# Patient Record
Sex: Male | Born: 1975 | Race: White | Hispanic: No | Marital: Single
Health system: Midwestern US, Community
[De-identification: ages and names within clinical notes are randomized; demographics above are authoritative.]

## PROBLEM LIST (undated history)

## (undated) DIAGNOSIS — F419 Anxiety disorder, unspecified: Secondary | ICD-10-CM

## (undated) DIAGNOSIS — I2699 Other pulmonary embolism without acute cor pulmonale: Secondary | ICD-10-CM

## (undated) DIAGNOSIS — F4310 Post-traumatic stress disorder, unspecified: Secondary | ICD-10-CM

## (undated) HISTORY — DX: Anxiety disorder, unspecified: F41.9

## (undated) HISTORY — DX: Post-traumatic stress disorder, unspecified: F43.10

## (undated) HISTORY — DX: Other pulmonary embolism without acute cor pulmonale: I26.99

---

## 2017-07-01 ENCOUNTER — Emergency Department: Admit: 2017-07-01 | Payer: Medicaid (Managed Care)

## 2017-07-01 ENCOUNTER — Emergency Department: Payer: Medicaid (Managed Care)

## 2017-07-01 ENCOUNTER — Inpatient Hospital Stay
Admit: 2017-07-01 | Discharge: 2017-07-03 | Disposition: A | Discharge status: Home / Self Care | Payer: Medicaid (Managed Care) | Source: Ambulatory Visit | Admitting: Internal Medicine

## 2017-07-01 DIAGNOSIS — K5732 Diverticulitis of large intestine without perforation or abscess without bleeding: Secondary | ICD-10-CM

## 2017-07-01 LAB — CBC WITH AUTO DIFFERENTIAL
Basophils %: 0.2 % (ref 0.0–1.0)
Basophils Absolute: 0 10*3/uL (ref 0.00–0.20)
Eosinophils %: 0.6 % (ref 0.0–5.0)
Eosinophils Absolute: 0.1 10*3/uL (ref 0.00–0.60)
Hematocrit: 48.6 % (ref 42.0–52.0)
Hemoglobin: 17.4 g/dL (ref 14.0–18.0)
Lymphocytes %: 3.8 % — ABNORMAL LOW (ref 20.0–40.0)
Lymphocytes Absolute: 0.4 10*3/uL — ABNORMAL LOW (ref 1.1–4.5)
MCH: 33.1 pg — ABNORMAL HIGH (ref 27.0–31.0)
MCHC: 35.8 g/dL (ref 33.0–37.0)
MCV: 92.4 fL (ref 80.0–94.0)
MPV: 9.9 fL (ref 9.4–12.4)
Monocytes %: 4.8 % (ref 0.0–10.0)
Monocytes Absolute: 0.5 10*3/uL (ref 0.00–0.90)
Neutrophils %: 90.2 % — ABNORMAL HIGH (ref 50.0–65.0)
Neutrophils Absolute: 9.4 10*3/uL — ABNORMAL HIGH (ref 1.5–7.5)
Platelets: 186 10*3/uL (ref 130–400)
RBC: 5.26 M/uL (ref 4.70–6.10)
RDW: 12.3 % (ref 11.5–14.5)
WBC: 10.4 10*3/uL (ref 4.8–10.8)

## 2017-07-01 LAB — COMPREHENSIVE METABOLIC PANEL
ALT: 30 U/L (ref 5–41)
AST: 24 U/L (ref 5–40)
Albumin: 4.5 g/dL (ref 3.5–5.2)
Alkaline Phosphatase: 62 U/L (ref 40–130)
Anion Gap: 14 mmol/L (ref 7–19)
BUN: 17 mg/dL (ref 6–20)
CO2: 22 mmol/L (ref 22–29)
Calcium: 9.5 mg/dL (ref 8.6–10.0)
Chloride: 100 mmol/L (ref 98–111)
Creatinine: 1.1 mg/dL (ref 0.5–1.2)
GFR Non-African American: >60 (ref >60)
Glucose: 114 mg/dL — ABNORMAL HIGH (ref 74–109)
Potassium: 4.4 mmol/L (ref 3.5–5.0)
Sodium: 136 mmol/L (ref 136–145)
Total Bilirubin: 0.9 mg/dL (ref 0.2–1.2)
Total Protein: 7.4 g/dL (ref 6.6–8.7)

## 2017-07-01 LAB — LIPASE: Lipase: 46 U/L (ref 13–60)

## 2017-07-01 LAB — URINALYSIS WITH REFLEX TO CULTURE
Bilirubin Urine: NEGATIVE
Blood, Urine: NEGATIVE
Glucose, Ur: NEGATIVE mg/dL
Leukocyte Esterase, Urine: NEGATIVE
Nitrite, Urine: NEGATIVE
Protein, UA: NEGATIVE mg/dL
Specific Gravity, UA: 1.016 (ref 1.005–1.030)
Urobilinogen, Urine: 0.2 E.U./dL (ref <2.0)
pH, UA: 6.5 (ref 5.0–8.0)

## 2017-07-01 LAB — ETHANOL: Ethanol Lvl: <10 mg/dL

## 2017-07-01 LAB — URINE DRUG SCREEN
Amphetamine Screen, Urine: NEGATIVE (ref <1000)
Barbiturate Screen, Ur: NEGATIVE (ref <200)
Benzodiazepine Screen, Urine: POSITIVE — AB (ref <100)
Cannabinoid Scrn, Ur: POSITIVE — AB (ref <50)
Cocaine Metabolite Screen, Urine: NEGATIVE (ref <300)
Opiate Scrn, Ur: NEGATIVE (ref <300)

## 2017-07-01 LAB — TYPE AND SCREEN
ABO/Rh: O NEG
Antibody Screen: NEGATIVE

## 2017-07-01 LAB — PROTIME-INR
INR: 1.05 (ref 0.88–1.18)
Protime: 13.1 s (ref 12.0–14.6)

## 2017-07-01 LAB — LACTIC ACID: Lactic Acid: 1.1 mmol/L (ref 0.5–1.9)

## 2017-07-01 LAB — TROPONIN: Troponin: <0.01 ng/mL (ref 0.00–0.03)

## 2017-07-01 MED ORDER — METRONIDAZOLE IN NACL 5-0.79 MG/ML-% IV SOLN
5 mg/mL | Freq: Once | INTRAVENOUS | Status: AC
Start: 2017-07-01 — End: 2017-07-01
  Administered 2017-07-01: 18:00:00 500 mg via INTRAVENOUS

## 2017-07-01 MED ORDER — METOCLOPRAMIDE HCL 5 MG/ML IJ SOLN
5 MG/ML | Freq: Once | INTRAMUSCULAR | Status: DC
Start: 2017-07-01 — End: 2017-07-01

## 2017-07-01 MED ORDER — SODIUM CHLORIDE 0.9 % IV SOLN
0.9 % | INTRAVENOUS | Status: DC
Start: 2017-07-01 — End: 2017-07-03
  Administered 2017-07-02 – 2017-07-03 (×2): via INTRAVENOUS

## 2017-07-01 MED ORDER — INFUVITE ADULT IV INJ
0.9 | Freq: Every day | INTRAVENOUS | Status: DC
Start: 2017-07-01 — End: 2017-07-03
  Administered 2017-07-01 – 2017-07-02 (×2): via INTRAVENOUS

## 2017-07-01 MED ORDER — ENOXAPARIN SODIUM 40 MG/0.4ML SC SOLN
40 MG/0.4ML | Freq: Every day | SUBCUTANEOUS | Status: DC
Start: 2017-07-01 — End: 2017-07-03
  Administered 2017-07-01 – 2017-07-02 (×2): 40 mg via SUBCUTANEOUS

## 2017-07-01 MED ORDER — ALUM & MAG HYDROXIDE-SIMETH 200-200-20 MG/5ML PO SUSP
200-200-205 MG/5ML | Freq: Once | ORAL | Status: AC
Start: 2017-07-01 — End: 2017-07-01
  Administered 2017-07-01: 17:00:00 via ORAL

## 2017-07-01 MED ORDER — LORAZEPAM 2 MG/ML IJ SOLN
2 MG/ML | Freq: Once | INTRAMUSCULAR | Status: AC
Start: 2017-07-01 — End: 2017-07-01
  Administered 2017-07-01: 17:00:00 1 mg via INTRAVENOUS

## 2017-07-01 MED ORDER — SODIUM CHLORIDE 0.9 % IV BOLUS
0.9 % | Freq: Once | INTRAVENOUS | Status: AC
Start: 2017-07-01 — End: 2017-07-01
  Administered 2017-07-01: 22:00:00 1000 mL via INTRAVENOUS

## 2017-07-01 MED ORDER — LORAZEPAM 2 MG/ML IJ SOLN
2 MG/ML | Freq: Once | INTRAMUSCULAR | Status: AC
Start: 2017-07-01 — End: 2017-07-01
  Administered 2017-07-01: 18:00:00 2 mg via INTRAVENOUS

## 2017-07-01 MED ORDER — SODIUM CHLORIDE 0.9 % IV BOLUS
0.9 % | Freq: Once | INTRAVENOUS | Status: AC
Start: 2017-07-01 — End: 2017-07-01
  Administered 2017-07-01: 17:00:00 1000 mL via INTRAVENOUS

## 2017-07-01 MED ORDER — METOPROLOL TARTRATE 5 MG/5ML IV SOLN
5 MG/ML | Freq: Once | INTRAVENOUS | Status: AC
Start: 2017-07-01 — End: 2017-07-01
  Administered 2017-07-01: 17:00:00 5 mg via INTRAVENOUS

## 2017-07-01 MED ORDER — NORMAL SALINE FLUSH 0.9 % IV SOLN
0.9 % | Freq: Two times a day (BID) | INTRAVENOUS | Status: DC
Start: 2017-07-01 — End: 2017-07-03
  Administered 2017-07-02: 01:00:00 10 mL via INTRAVENOUS

## 2017-07-01 MED ORDER — DIAZEPAM 5 MG PO TABS
5 MG | Freq: Two times a day (BID) | ORAL | Status: DC | PRN
Start: 2017-07-01 — End: 2017-07-03
  Administered 2017-07-01 – 2017-07-03 (×3): 5 mg via ORAL

## 2017-07-01 MED ORDER — CIPROFLOXACIN HCL 500 MG PO TABS
500 MG | Freq: Once | ORAL | Status: AC
Start: 2017-07-01 — End: 2017-07-01
  Administered 2017-07-01: 18:00:00 500 mg via ORAL

## 2017-07-01 MED ORDER — ATORVASTATIN CALCIUM 20 MG PO TABS
20 MG | Freq: Every day | ORAL | Status: DC
Start: 2017-07-01 — End: 2017-07-03
  Administered 2017-07-02 – 2017-07-03 (×2): 20 mg via ORAL

## 2017-07-01 MED ORDER — NORMAL SALINE FLUSH 0.9 % IV SOLN
0.9 | INTRAVENOUS | Status: DC | PRN
Start: 2017-07-01 — End: 2017-07-03

## 2017-07-01 MED ORDER — ACETAMINOPHEN 325 MG PO TABS
325 | Freq: Four times a day (QID) | ORAL | Status: DC | PRN
Start: 2017-07-01 — End: 2017-07-03

## 2017-07-01 MED ORDER — METRONIDAZOLE IN NACL 5-0.79 MG/ML-% IV SOLN
5 mg/mL | Freq: Three times a day (TID) | INTRAVENOUS | Status: DC
Start: 2017-07-01 — End: 2017-07-03
  Administered 2017-07-02 – 2017-07-03 (×5): 500 mg via INTRAVENOUS

## 2017-07-01 MED ORDER — IOPAMIDOL 76 % IV SOLN
76 % | Freq: Once | INTRAVENOUS | Status: AC | PRN
Start: 2017-07-01 — End: 2017-07-01
  Administered 2017-07-01: 16:00:00 90 mL via INTRAVENOUS

## 2017-07-01 MED ORDER — ASPIRIN 81 MG PO CHEW
81 MG | Freq: Every day | ORAL | Status: DC
Start: 2017-07-01 — End: 2017-07-03
  Administered 2017-07-01 – 2017-07-03 (×3): 81 mg via ORAL

## 2017-07-01 MED ORDER — CIPROFLOXACIN HCL 500 MG PO TABS
500 MG | Freq: Two times a day (BID) | ORAL | Status: DC
Start: 2017-07-01 — End: 2017-07-03
  Administered 2017-07-02 – 2017-07-03 (×4): 500 mg via ORAL

## 2017-07-01 MED FILL — LORAZEPAM 2 MG/ML IJ SOLN: 2 mg/mL | INTRAMUSCULAR | Qty: 1

## 2017-07-01 MED FILL — METOPROLOL TARTRATE 5 MG/5ML IV SOLN: 5 mg/mL | INTRAVENOUS | Qty: 5

## 2017-07-01 MED FILL — FOLIC ACID 5 MG/ML IJ SOLN: 5 mg/mL | INTRAMUSCULAR | Qty: 0.2

## 2017-07-01 MED FILL — METOCLOPRAMIDE HCL 5 MG/ML IJ SOLN: 5 mg/mL | INTRAMUSCULAR | Qty: 2

## 2017-07-01 MED FILL — ENOXAPARIN SODIUM 40 MG/0.4ML SC SOLN: 40 MG/0.4ML | SUBCUTANEOUS | Qty: 0.4

## 2017-07-01 MED FILL — MAG-AL PLUS 200-200-20 MG/5ML PO LIQD: 200-200-20 MG/5ML | ORAL | Qty: 30

## 2017-07-01 MED FILL — ATORVASTATIN CALCIUM 20 MG PO TABS: 20 mg | ORAL | Qty: 1

## 2017-07-01 MED FILL — ASPIRIN 81 MG PO CHEW: 81 mg | ORAL | Qty: 1

## 2017-07-01 MED FILL — CIPROFLOXACIN HCL 500 MG PO TABS: 500 mg | ORAL | Qty: 1

## 2017-07-01 MED FILL — DIAZEPAM 5 MG PO TABS: 5 mg | ORAL | Qty: 1

## 2017-07-01 MED FILL — METRONIDAZOLE IN NACL 500-0.79 MG/100ML-% IV SOLN: 500-0.79 MG/100ML-% | INTRAVENOUS | Qty: 100

## 2017-07-01 NOTE — ED Notes (Signed)
Pt report called to floor to [facility] RN) to take over pt care Pt admission in progress      Assunta Gambles, RN  07/01/17 1500

## 2017-07-01 NOTE — ED Notes (Signed)
Bed: 15  Expected date:   Expected time:   Means of arrival:   Comments:  Reather Converse, RN  07/01/17 1041

## 2017-07-01 NOTE — H&P (Signed)
[employer] - History & Physical    [id number]  PCP: No primary care provider on file.  Date of Admission:07/01/2017   Date of Service: Pt seen/examined on5/07/2017 and Admitted to Inpatient with expected LOS greater than two midnights due to medical therapy.     Chief Complaint:  Diarrhea    History Of Present Illness:   The patient is a 41 y.o. male who presented complaining of about 1 day of diarrhea. Pt reports about 6-7 episodes of loose stools day prior to admission. Associated with generalized feeling of unwell, subjective fevers, chills, and abd pain. Pt also noted streaks of blood in the stool. Today, his diarrhea has improved, but pt continued to feel unwell with F/C prompting ED visit. Of note, pt does take valium and ativan for reported vertigo and anxiety. He has been out of his ativan for several days, but has still been taking his valium. Denies any agitation, shaking, SOB, CP, N/V.     Review of Systems   All other systems reviewed and are negative.      Past Medical History:        Diagnosis Date   ??? Anxiety    ??? Hypertension    ??? Vertigo        Past Surgical History:    History reviewed. No pertinent surgical history.    Home Medications:  Prior to Admission medications    Medication Sig Start Date End Date Taking? Authorizing Provider   LORazepam (ATIVAN) 2 MG tablet Take 2 mg by mouth 2 times daily as needed for Anxiety.   Yes Historical Provider, MD   diazepam (VALIUM) 5 MG tablet Take 5 mg by mouth 2 times daily as needed for Anxiety.   Yes Historical Provider, MD   atorvastatin (LIPITOR) 20 MG tablet Take 20 mg by mouth daily   Yes Historical Provider, MD   aspirin 81 MG tablet Take 81 mg by mouth daily   Yes Historical Provider, MD   labetalol (NORMODYNE) 100 MG tablet Take 100 mg by mouth daily   Yes Historical Provider, MD       Allergies:    Celexa [citalopram hydrobromide]; Demerol hcl [meperidine]; Dilaudid [hydromorphone hcl]; Fentanyl; Morphine and related;  Pcn [penicillins]; Phenergan [promethazine hcl]; Zofran [ondansetron hcl]; and Zoloft [sertraline hcl]    Social History:    Tobacco:   reports that he has never smoked. His smokeless tobacco use includes snuff.  Alcohol:   reports that he drinks alcohol.  Illicit Drugs: denies    Family History:      Problem Relation Age of Onset   ??? Cancer Father          Physical Examination:  BP 131/80    Pulse 127    Temp 98.4 ??F (36.9 ??C) (Temporal)    Resp 20    Wt 206 lb (93.4 kg)    SpO2 97%   General appearance: alert, cooperative and no distress  Head: Normocephalic, without obvious abnormality, atraumatic  Eyes: conjunctivae/corneas clear. PERRL, EOM's intact.   Ears:normal external ears  Neck:  supple, symmetrical, trachea midline  Lungs:  clear to auscultation bilaterally  Heart: regular rhythm taqchy, S1, S2   Abdomen:soft, mildly tender; distended +ve bowel sounds  Extremities:Pedal edema none    Skin: Warm, diaphoretic, No rashes or lesions  Neurologic: Alert and oriented X 3, no focal deficits appreciated  Psychiatric:  Normal mood      Data:    CBC:  Recent Labs     07/01/17  1050   WBC 10.4   HGB 17.4   HCT 48.6   PLT 186     BMP:  Recent Labs     07/01/17  1050   NA 136   K 4.4   CL 100   CO2 22   BUN 17   CREATININE 1.1   CALCIUM 9.5     Recent Labs     07/01/17  1050   AST 24   ALT 30   BILITOT 0.9   ALKPHOS 62     Coag Panel:   Recent Labs     07/01/17  1050   INR 1.05   PROTIME 13.1     Cardiac Enzymes:   Recent Labs     07/01/17  1050   TROPONINI <0.01     ABGs:No results found for: PHART, PO2ART, PCO2ART  Urinalysis:  Lab Results   Component Value Date    NITRU Negative 07/01/2017    BLOODU Negative 07/01/2017    SPECGRAV 1.016 07/01/2017    GLUCOSEU Negative 07/01/2017       Imaging:  CT Abd/Pelvis  Impression: ??   Impression:  1. Very minimal inflammatory stranding around the sigmoid colon  possibly reflecting very early acute diverticulitis.  2. Hepatic steatosis.  Signed by Dr Donnalee Curry on 07/01/2017  12:46 PM     CXR  Impression: ??   No active cardiopulmonary disease.  The above finding are recorded on a digital voice clip in PACS.  Signed by Dr Nila Nephew on 07/01/2017 12:29 PM         Active Hospital Problems    Diagnosis Date Noted   ??? Benzodiazepine withdrawal without complication (HCC) [F13.230] 07/01/2017   ??? Diverticulitis [K57.92] 07/01/2017   ??? Hypertension [I10]    ??? Vertigo [R42]    ??? Anxiety [F41.9]        IMPRESSION   Principal Problem:    Diverticulitis  Active Problems:    Benzodiazepine withdrawal without complication (HCC)    Hypertension    Vertigo    Anxiety  Resolved Problems:    * No resolved hospital problems. *      PLAN:  1) Likely due to diverticulitis. Starting IVF, cont Cipro/flagyl. F/u cdiff. Pt requesting food, will give diet. Symptom control.     2) Some concern for benzodiazepine withdrawal as pt ran out of ativan. Pt does report still taking his valium. No tremors, agitation. Will cont home valium. Monitor for further signs of withdrawal.     3) Hx of htn, currently normotensive. Will monitor and add agents as needed.     Teofilo Pod, MD  07/01/2017

## 2017-07-01 NOTE — Progress Notes (Signed)
Donald Maddox arrived to room # 332.   Presented with: dizzyness  Mental Status: Patient is oriented, alert, coherent, logical, thought processes intact and able to concentrate and follow conversation.   Vitals:    07/01/17 1517   BP: 131/80   Pulse: 127   Resp: 20   Temp: 98.4 ??F (36.9 ??C)   SpO2: 97%     Patient safety contract and falls prevention contract reviewed with patient Yes.  Oriented Patient to room.  Call light within reach. Yes.  Needs, issues or concerns expressed at this time: no.  No questions at this time        Electronically signed by Lula Olszewski, RN on 07/01/2017 at 4:36 PM

## 2017-07-01 NOTE — ED Provider Notes (Signed)
[facility] REN/VAS/MED  eMERGENCY dEPARTMENT eNCOUnter      Pt Name: Donald Maddox  MRN: [id number]  Birthdate 08-20-1975  Date of evaluation: 07/01/2017  Provider: Senaida Lange, MD    CHIEF COMPLAINT       Chief Complaint   Patient presents with   ??? Abdominal Pain   ??? Chest Pain   ??? Arm Pain   ??? Rectal Bleeding         HISTORY OF PRESENT ILLNESS   (Location/Symptom, Timing/Onset,Context/Setting, Quality, Duration, Modifying Factors, Severity)  Note limiting factors.   Donald Maddox is a 42 y.o. male who presents to the emergency department with abd pain starting in lower abd causing severe anxiety and panic. States he has travelled around for jobs recent hospitalization in [place] 3 weeks ago. Needs his benzos is out x 2-3 days. On them long term and on SSI. Here for work but can't states now with blood in stool, has shakes and chills, developed some severe lower cramping abd pain. Now with vomiting at times too with then esophagus pain radiating up after vomiting in chest. Afebrile but shaking and extremely anxious. States he tried to drink 6 beers when belly pain started and then at ribs and potato salad and then felt worse.     The history is provided by the patient.       NursingNotes were reviewed.    REVIEW OF SYSTEMS    (2-9 systems for level 4, 10 or more for level 5)     Review of Systems   Constitutional: Positive for chills.   Respiratory: Negative for cough.    Cardiovascular: Positive for chest pain.   Gastrointestinal: Positive for abdominal pain, blood in stool, diarrhea and vomiting.   Neurological: Negative for syncope.   Psychiatric/Behavioral: Negative for confusion and hallucinations. The patient is nervous/anxious. The patient is not hyperactive.        A complete review of systems was performed and is negative except as noted above in the HPI.       PAST MEDICAL HISTORY     Past Medical History:   Diagnosis Date   ??? Anxiety    ??? Hypertension    ??? Vertigo          SURGICAL HISTORY     History  reviewed. No pertinent surgical history.      CURRENT MEDICATIONS       Current Discharge Medication List      CONTINUE these medications which have NOT CHANGED    Details   LORazepam (ATIVAN) 2 MG tablet Take 2 mg by mouth 2 times daily as needed for Anxiety.      diazepam (VALIUM) 5 MG tablet Take 5 mg by mouth 2 times daily as needed for Anxiety.      atorvastatin (LIPITOR) 20 MG tablet Take 20 mg by mouth daily      aspirin 81 MG tablet Take 81 mg by mouth daily      labetalol (NORMODYNE) 100 MG tablet Take 100 mg by mouth daily             ALLERGIES     Celexa [citalopram hydrobromide]; Demerol hcl [meperidine]; Dilaudid [hydromorphone hcl]; Fentanyl; Morphine and related; Pcn [penicillins]; Phenergan [promethazine hcl]; Zofran [ondansetron hcl]; and Zoloft [sertraline hcl]    FAMILY HISTORY       Family History   Problem Relation Age of Onset   ??? Cancer Father           SOCIAL  HISTORY       Social History     Socioeconomic History   ??? Marital status: Single     Spouse name: None   ??? Number of children: None   ??? Years of education: None   ??? Highest education level: None   Occupational History   ??? None   Social Needs   ??? Financial resource strain: None   ??? Food insecurity:     Worry: None     Inability: None   ??? Transportation needs:     Medical: None     Non-medical: None   Tobacco Use   ??? Smoking status: Never Smoker   ??? Smokeless tobacco: Current User     Types: Snuff   Substance and Sexual Activity   ??? Alcohol use: Yes     Comment: occ   ??? Drug use: Yes     Types: Marijuana     Comment: occ   ??? Sexual activity: Yes     Partners: Female   Lifestyle   ??? Physical activity:     Days per week: None     Minutes per session: None   ??? Stress: None   Relationships   ??? Social connections:     Talks on phone: None     Gets together: None     Attends religious service: None     Active member of club or organization: None     Attends meetings of clubs or organizations: None     Relationship status: None   ??? Intimate  partner violence:     Fear of current or ex partner: None     Emotionally abused: None     Physically abused: None     Forced sexual activity: None   Other Topics Concern   ??? None   Social History Narrative   ??? None       SCREENINGS    Glasgow Coma Scale  Eye Opening: Spontaneous  Best Verbal Response: Oriented  Best Motor Response: Obeys commands  Glasgow Coma Scale Score: 15        PHYSICAL EXAM    (up to 7 for level 4, 8 or more for level 5)     ED Triage Vitals   BP Temp Temp Source Pulse Resp SpO2 Height Weight   07/01/17 1047 07/01/17 1300 07/01/17 1517 07/01/17 1047 07/01/17 1047 07/01/17 1045 -- 07/01/17 1448   (!) 134/94 99.4 ??F (37.4 ??C) Temporal 131 22 97 %  206 lb (93.4 kg)       Physical Exam   Constitutional: He is oriented to person, place, and time. He appears well-developed and well-nourished. He appears distressed.   Extremely anxious    HENT:   Head: Normocephalic and atraumatic.   Eyes: Pupils are equal, round, and reactive to light.   Neck: Normal range of motion. Neck supple.   Cardiovascular: Regular rhythm.   133   Pulmonary/Chest: Effort normal and breath sounds normal. No respiratory distress.   Abdominal: Soft. There is tenderness (lower abd ).   Musculoskeletal: Normal range of motion. He exhibits no deformity.   Neurological: He is alert and oriented to person, place, and time. GCS eye subscore is 4. GCS verbal subscore is 5. GCS motor subscore is 6.   Skin: Skin is warm and dry.   Psychiatric:   Anxious shaking at times    Nursing note and vitals reviewed.      DIAGNOSTIC RESULTS     EKG: All  EKG's are interpreted by the Emergency Department Physician who either signs or Co-signs this chart in the absence of a cardiologist.    Sinus tachy    RADIOLOGY:   Non-plain film images such as CT, Ultrasound and MRI are read by the radiologist. Plainradiographic images are visualized and preliminarily interpreted by the emergency physician with the below findings:        Interpretation per the  Radiologist below, if available at the time of this note:    XR CHEST STANDARD (2 VW)   Final Result   No active cardiopulmonary disease.   The above finding are recorded on a digital voice clip in PACS.   Signed by Dr Nila Nephew on 07/01/2017 12:29 PM      CT ABDOMEN PELVIS W IV CONTRAST Additional Contrast? None   Final Result   Impression:   1. Very minimal inflammatory stranding around the sigmoid colon   possibly reflecting very early acute diverticulitis.   2. Hepatic steatosis.   Signed by Dr Donnalee Curry on 07/01/2017 12:46 PM      US SCROTUM AND TESTICLES    (Results Pending)         ED BEDSIDE ULTRASOUND:   Performed by ED Physician - none    LABS:  Labs Reviewed   COMPREHENSIVE METABOLIC PANEL - Abnormal; Notable for the following components:       Result Value    Glucose 114 (*)     All other components within normal limits   CBC WITH AUTO DIFFERENTIAL - Abnormal; Notable for the following components:    MCH 33.1 (*)     Neutrophils % 90.2 (*)     Lymphocytes % 3.8 (*)     Neutrophils # 9.4 (*)     Lymphocytes # 0.4 (*)     All other components within normal limits   URINE RT REFLEX TO CULTURE - Abnormal; Notable for the following components:    Ketones, Urine TRACE (*)     All other components within normal limits   URINE DRUG SCREEN - Abnormal; Notable for the following components:    Benzodiazepine Screen, Urine Positive (*)     Cannabinoid Scrn, Ur Positive (*)     All other components within normal limits   COMPREHENSIVE METABOLIC PANEL W/ REFLEX TO MG FOR LOW K - Abnormal; Notable for the following components:    CO2 21 (*)     Total Protein 6.1 (*)     All other components within normal limits   CBC WITH AUTO DIFFERENTIAL - Abnormal; Notable for the following components:    RBC 4.60 (*)     MCH 32.4 (*)     MPV 9.3 (*)     Neutrophils % 72.0 (*)     Lymphocytes % 16.7 (*)     Lymphocytes # 1.0 (*)     All other components within normal limits   CULTURE STOOL   C DIFF TOXIN/ANTIGEN   C DIFF  TOXIN/ANTIGEN   LIPASE   PROTIME-INR   TROPONIN   ETHANOL   LACTIC ACID, PLASMA   D-DIMER, QUANTITATIVE   TYPE AND SCREEN       All other labs were within normal range or not returned as of this dictation.    EMERGENCY DEPARTMENT COURSE and DIFFERENTIALDIAGNOSIS/MDM:   Vitals:    Vitals:    07/01/17 1517 07/01/17 1925 07/02/17 0945 07/02/17 1045   BP: 131/80 128/70 128/70 128/79   Pulse: 127 120 120  Resp: 20 18     Temp: 98.4 ??F (36.9 ??C) 99.3 ??F (37.4 ??C)     TempSrc: Temporal Temporal     SpO2: 97% 95%     Weight:           MDM  Number of Diagnoses or Management Options  Benzodiazepine withdrawal without complication (HCC): new and requires workup  Diverticulitis of colon: new and requires workup  Diagnosis management comments: Pt with abd pain as chief complaint, has had n/v/d had cp after vomiting.     Tachy and extremely anxious as well.    Unable to give stool sample but states blood in it.    I do think he has some diverticulitis on my review of scan.    Tachy likely from with drawal?     Denies heavy etoh use.    Not from here.    Even after 3 mg iv ativan and 5 mg lopressor still tachy.    Unclear if all withdrawal treat infection as well.    Admit to hospitalist.        Amount and/or Complexity of Data Reviewed  Clinical lab tests: ordered and reviewed  Tests in the radiology section of CPT??: ordered and reviewed  Independent visualization of images, tracings, or specimens: yes    Patient Progress  Patient progress: stable        CONSULTS:  None    PROCEDURES:  Unless otherwise notedbelow, none     Procedures    FINAL IMPRESSION     1. Diverticulitis of colon    2. Benzodiazepine withdrawal without complication (HCC)          DISPOSITION/PLAN   DISPOSITION Admitted 07/01/2017 02:25:47 PM      PATIENT REFERRED TO:  No follow-up provider specified.    DISCHARGE MEDICATIONS:  Current Discharge Medication List             (Please note that portions of this note were completed with a voice recognition  program.  Efforts were made to edit the dictations butoccasionally words are mis-transcribed.)    Senaida Lange, MD (electronically signed)  AttendingEmergency Physician          Senaida Lange, MD  07/02/17 1155

## 2017-07-01 NOTE — ED Triage Notes (Signed)
Pt to ED with c/o abdominal pain with rectal bleeding x24 hours

## 2017-07-01 NOTE — Progress Notes (Signed)
4 Eyes Skin Assessment    Donald Maddox is being assessed upon: Admission    I agree that I, Lula Olszewski, along with Theresa Mulligan (either 2 RN's or 1 LPN and 1 RN) have performed a thorough Head to Toe Skin Assessment on the patient. ALL assessment sites listed below have been assessed.      Areas assessed by both nurses:        Head, Face, and Ears      Shoulders, Back, and Chest     Arms, Elbows, and Hands      Coccyx, Sacrum, and Ischium     Legs, Feet, and Heels    Does the Patient have Skin Breakdown? No    Braden Prevention initiated: No  Wound Care Orders initiated: No    WOC nurse consulted for Pressure Injury (Stage 3,4, Unstageable, DTI, NWPT, and Complex wounds) and New or Established Ostomies: NA        Primary Nurse eSignature: Lula Olszewski, RN on 07/01/2017 at 4:35 PM      Co-Signer eSignature: Electronically signed by Jomarie Longs, RN on 07/01/17 at 4:37 PM

## 2017-07-01 NOTE — ED Notes (Signed)
Paged the Hospitalist @ [facility]  07/01/17 1418

## 2017-07-01 NOTE — ED Triage Notes (Signed)
Pt reports to ED c/o bright red rectal bleeding x 2 days, right arm and shoulder pain, and chest pain. Pt also c/o headache and light sensitivity. Pt is anxious. EMS states pt commented that he felt as if he was going to die en route.

## 2017-07-02 ENCOUNTER — Inpatient Hospital Stay: Admit: 2017-07-02 | Payer: Medicaid (Managed Care)

## 2017-07-02 ENCOUNTER — Inpatient Hospital Stay: Payer: Medicaid (Managed Care)

## 2017-07-02 LAB — EKG 12-LEAD
P Axis: 74 degrees
P Axis: 71 degrees
P-R Interval: 136 ms
P-R Interval: 144 ms
Q-T Interval: 274 ms
Q-T Interval: 306 ms
QRS Duration: 84 ms
QRS Duration: 82 ms
QTc Calculation (Bazett): 408 ms
QTc Calculation (Bazett): 383 ms
T Axis: 52 degrees
T Axis: 61 degrees

## 2017-07-02 LAB — COMPREHENSIVE METABOLIC PANEL W/ REFLEX TO MG FOR LOW K
ALT: 22 U/L (ref 5–41)
AST: 18 U/L (ref 5–40)
Albumin: 3.7 g/dL (ref 3.5–5.2)
Alkaline Phosphatase: 44 U/L (ref 40–130)
Anion Gap: 13 mmol/L (ref 7–19)
BUN: 13 mg/dL (ref 6–20)
CO2: 21 mmol/L — ABNORMAL LOW (ref 22–29)
Calcium: 8.9 mg/dL (ref 8.6–10.0)
Chloride: 105 mmol/L (ref 98–111)
Creatinine: 1.2 mg/dL (ref 0.5–1.2)
GFR Non-African American: >60 (ref >60)
Glucose: 98 mg/dL (ref 74–109)
Potassium reflex Magnesium: 4.3 mmol/L (ref 3.5–5.0)
Sodium: 139 mmol/L (ref 136–145)
Total Bilirubin: 0.6 mg/dL (ref 0.2–1.2)
Total Protein: 6.1 g/dL — ABNORMAL LOW (ref 6.6–8.7)

## 2017-07-02 LAB — CBC WITH AUTO DIFFERENTIAL
Basophils %: 0.2 % (ref 0.0–1.0)
Basophils Absolute: 0 10*3/uL (ref 0.00–0.20)
Eosinophils %: 0.9 % (ref 0.0–5.0)
Eosinophils Absolute: 0.1 10*3/uL (ref 0.00–0.60)
Hematocrit: 42.8 % (ref 42.0–52.0)
Hemoglobin: 14.9 g/dL (ref 14.0–18.0)
Lymphocytes %: 16.7 % — ABNORMAL LOW (ref 20.0–40.0)
Lymphocytes Absolute: 1 10*3/uL — ABNORMAL LOW (ref 1.1–4.5)
MCH: 32.4 pg — ABNORMAL HIGH (ref 27.0–31.0)
MCHC: 34.8 g/dL (ref 33.0–37.0)
MCV: 93 fL (ref 80.0–94.0)
MPV: 9.3 fL — ABNORMAL LOW (ref 9.4–12.4)
Monocytes %: 9.7 % (ref 0.0–10.0)
Monocytes Absolute: 0.6 10*3/uL (ref 0.00–0.90)
Neutrophils %: 72 % — ABNORMAL HIGH (ref 50.0–65.0)
Neutrophils Absolute: 4.2 10*3/uL (ref 1.5–7.5)
Platelets: 150 10*3/uL (ref 130–400)
RBC: 4.6 M/uL — ABNORMAL LOW (ref 4.70–6.10)
RDW: 12.4 % (ref 11.5–14.5)
WBC: 5.8 10*3/uL (ref 4.8–10.8)

## 2017-07-02 LAB — D-DIMER, QUANTITATIVE: D-Dimer, Quant: 0.49 ug/mL FEU — ABNORMAL HIGH (ref 0.00–0.48)

## 2017-07-02 MED ORDER — IOPAMIDOL 76 % IV SOLN
76 % | Freq: Once | INTRAVENOUS | Status: AC | PRN
Start: 2017-07-02 — End: 2017-07-02
  Administered 2017-07-02: 19:00:00 90 mL via INTRAVENOUS

## 2017-07-02 MED ORDER — HYDROCODONE-ACETAMINOPHEN 7.5-325 MG PO TABS
7.5-325 MG | Freq: Four times a day (QID) | ORAL | Status: DC | PRN
Start: 2017-07-02 — End: 2017-07-03
  Administered 2017-07-02 – 2017-07-03 (×3): 1 via ORAL

## 2017-07-02 MED ORDER — LABETALOL HCL 200 MG PO TABS
200 MG | Freq: Every day | ORAL | Status: DC
Start: 2017-07-02 — End: 2017-07-03
  Administered 2017-07-02 – 2017-07-03 (×2): 50 mg via ORAL

## 2017-07-02 MED ORDER — DOCUSATE SODIUM 100 MG PO CAPS
100 MG | Freq: Every day | ORAL | Status: DC
Start: 2017-07-02 — End: 2017-07-03
  Administered 2017-07-02: 21:00:00 100 mg via ORAL

## 2017-07-02 MED FILL — METRONIDAZOLE IN NACL 500-0.79 MG/100ML-% IV SOLN: 500-0.79 MG/100ML-% | INTRAVENOUS | Qty: 100

## 2017-07-02 MED FILL — ASPIRIN 81 MG PO CHEW: 81 mg | ORAL | Qty: 1

## 2017-07-02 MED FILL — CIPROFLOXACIN HCL 500 MG PO TABS: 500 mg | ORAL | Qty: 1

## 2017-07-02 MED FILL — DOCUSATE SODIUM 100 MG PO CAPS: 100 mg | ORAL | Qty: 1

## 2017-07-02 MED FILL — DIAZEPAM 5 MG PO TABS: 5 mg | ORAL | Qty: 1

## 2017-07-02 MED FILL — ATORVASTATIN CALCIUM 20 MG PO TABS: 20 mg | ORAL | Qty: 1

## 2017-07-02 MED FILL — HYDROCODONE-ACETAMINOPHEN 7.5-325 MG PO TABS: 7.5-325 mg | ORAL | Qty: 1

## 2017-07-02 MED FILL — LABETALOL HCL 200 MG PO TABS: 200 mg | ORAL | Qty: 1

## 2017-07-02 MED FILL — ENOXAPARIN SODIUM 40 MG/0.4ML SC SOLN: 40 MG/0.4ML | SUBCUTANEOUS | Qty: 0.4

## 2017-07-02 MED FILL — FOLIC ACID 5 MG/ML IJ SOLN: 5 mg/mL | INTRAMUSCULAR | Qty: 0.2

## 2017-07-02 NOTE — Plan of Care (Signed)
Problem: Falls - Risk of:  Goal: Will remain free from falls  Description  Will remain free from falls  Outcome: Ongoing  Goal: Absence of physical injury  Description  Absence of physical injury  Outcome: Ongoing     Problem: Infection:  Goal: Will remain free from infection  Description  Will remain free from infection  Outcome: Ongoing     Problem: Safety:  Goal: Free from accidental physical injury  Description  Free from accidental physical injury  Outcome: Ongoing  Goal: Free from intentional harm  Description  Free from intentional harm  Outcome: Ongoing     Problem: Daily Care:  Goal: Daily care needs are met  Description  Daily care needs are met  Outcome: Ongoing     Problem: Pain:  Goal: Patient's pain/discomfort is manageable  Description  Patient's pain/discomfort is manageable  Outcome: Ongoing     Problem: Skin Integrity:  Goal: Skin integrity will stabilize  Description  Skin integrity will stabilize  Outcome: Ongoing     Problem: Discharge Planning:  Goal: Patients continuum of care needs are met  Description  Patients continuum of care needs are met  Outcome: Ongoing     Problem: ABCDS Injury Assessment  Goal: Absence of physical injury  Outcome: Ongoing     Problem: Bowel/Gastric:  Goal: Occurrences of diarrhea will decrease  Description  Occurrences of diarrhea will decrease  Outcome: Ongoing     Problem: Fluid Volume:  Goal: Will show no signs and symptoms of electrolyte imbalance  Description  Will show no signs and symptoms of electrolyte imbalance  Outcome: Ongoing     Problem: Physical Regulation:  Goal: Prevent transmision of infection  Description  Prevent transmision of infection  Outcome: Ongoing  Goal: Ability to avoid or minimize complications of infection will improve  Description  Ability to avoid or minimize complications of infection will improve  Outcome: Ongoing  Goal: Signs and symptoms of infection will decrease  Description  Signs and symptoms of infection will  decrease  Outcome: Ongoing     Problem: Skin Integrity:  Goal: Risk for impaired skin integrity will decrease  Description  Risk for impaired skin integrity will decrease  Outcome: Ongoing

## 2017-07-02 NOTE — Progress Notes (Signed)
Patient has had several stools today, but none were visualized by nursing staff. Patient states "it was bright red, I saved the toilet paper so you could see the blood". Donald Holmes, PA, notified and GI has been consulted. Unable to collect stool specimen as no collection device was available when patient needed to use the bathroom. Patient made aware that stool specimen is still needed and collection device has been placed in toilet. Will continue to monitor.

## 2017-07-02 NOTE — Consults (Signed)
Inpatient consult to GI  Consult performed by: Cordella Register, DO  Consult ordered by: Doylene Canard, PA-C          GI Consult Note    Pt Name: Donald Maddox  MRN: [id number]  [id number]  Birthdate: April 12, 1975  Admit Date: 07/01/2017 10:41 AM  Date of evaluation: 07/02/2017  Primary Care Physician: No primary care provider on file.   0332/332-01       CC:  Diverticulitis, BRBPR    HPI: 42yr male with PMHx hemorrhoids, anxiety, PE, and HTN presented to the hospital with abdominal pain that increased in intensity. He states he [place] in his [place] and his abdominal pain worsened to a point that he needed to come to the ER. The abdominal pain is most intense in the LLQ. He denies hematemesis, melena, and hematochezia. Of note, he has a history of hemorrhoids and underwent a colonoscopy in [place] approximately 30yrs ago in which hemorrhoids were detected. No further intervention required at that time. With respect to the rectal bleeding, it was stool with minimal amount coating the outside.       Past Medical History:        Diagnosis Date   ??? Anxiety    ??? History of pulmonary embolus (PE)    ??? Hypertension    ??? Vertigo      Past Surgical History:    History reviewed. No pertinent surgical history.  Social History:   Social History     Tobacco Use   ??? Smoking status: Never Smoker   ??? Smokeless tobacco: Current User     Types: Snuff   Substance Use Topics   ??? Alcohol use: Yes     Comment: occ     Family History:   Family History   Problem Relation Age of Onset   ??? Cancer Father      Home Meds:  Prior to Admission medications    Medication Sig Start Date End Date Taking? Authorizing Provider   LORazepam (ATIVAN) 2 MG tablet Take 2 mg by mouth 2 times daily as needed for Anxiety.   Yes Historical Provider, MD   diazepam (VALIUM) 5 MG tablet Take 5 mg by mouth 2 times daily as needed for Anxiety.   Yes Historical Provider, MD   atorvastatin (LIPITOR) 20 MG tablet Take 20 mg by mouth daily    Yes Historical Provider, MD   aspirin 81 MG tablet Take 81 mg by mouth daily   Yes Historical Provider, MD   labetalol (NORMODYNE) 100 MG tablet Take 100 mg by mouth daily   Yes Historical Provider, MD      Allergies:  Celexa [citalopram hydrobromide]; Demerol hcl [meperidine]; Dilaudid [hydromorphone hcl]; Fentanyl; Morphine and related; Pcn [penicillins]; Phenergan [promethazine hcl]; Zofran [ondansetron hcl]; and Zoloft [sertraline hcl]      Current Meds:     ??? labetalol  50 mg Oral Daily   ??? ciprofloxacin  500 mg Oral 2 times per day   ??? metroNIDAZOLE  500 mg Intravenous Q8H   ??? aspirin  81 mg Oral Daily   ??? atorvastatin  20 mg Oral Daily   ??? sodium chloride flush  10 mL Intravenous 2 times per day   ??? enoxaparin  40 mg Subcutaneous Daily   ??? folic acid, thiamine, multi-vitamin with vitamin K infusion   Intravenous Daily       ??? sodium chloride 100 mL/hr at 07/02/17 0445  PRN Meds:  HYDROcodone-acetaminophen, diazepam, sodium chloride flush, acetaminophen        ROS:  ROS NEGATIVE EXCEPT THOSE MARKED WITH AN "X"    GENERAL:  Fevers,  chills,  generalized weakness,  weight loss, weight gain,  anorexia  Skin/Breast:  jaundice,  new rashes,  itching   Eyes/Ears/Nose/Mouth/Throat:  change in vision,  double vision,  light headiness,  vertigo  CARDIOVASCULAR:  chest pain,  palpitations, [ syncope,  dyspnea on exertion,  orthopnea  RESPIRATORY:  SOB,  cough,  wheezing,  hemoptysis  GI:  dark stools,  bloody stools,  BRBPR,  abdominal pain,  GERD like symptoms,  nausea,  vomiting,  hematemesis,  jaundice,  constipation,  diarrhea,  hemorrhoids,  change in bowel habits,  bowel incontinence  GU:  Dysuria,  urgency,  frequency,  change in urine color,  discharge  MUSCULOSKELETAL:  muscle pain,  muscle swelling,  joint pain,  muscle weakness  Neurological/Psychiatric:  Sensory disturbances,  motor disturbances,   difficulty with speech,  paresthesias,  paralysis,  depression,  anxiety   Allergy/Immunological/Lymphatic/Endocrine:  anemia,  rashes,  polyuria,  polydypsia      Physical Exam:  Vitals:    07/01/17 1517 07/01/17 1925 07/02/17 0945 07/02/17 1045   BP: 131/80 128/70 128/70 128/79   Pulse: 127 120 120    Resp: 20 18     Temp: 98.4 ??F (36.9 ??C) 99.3 ??F (37.4 ??C)     TempSrc: Temporal Temporal     SpO2: 97% 95%     Weight:           Constitutional:  NAD,  of stated age,  anxious in appearance  Eyes:  conjunctiva clear,  Non inflamed irises,  no scleral icterus   ENT/Mouth:   Nares patent with pink mucosa,  oropharynx clear without exudates or erythema,  hearing grossly normal  Head/Neck:  symmetrical,  supple  Lungs:  respirations non labored with good effort,  no respiratory distress,   Equal air entry bilaterally  Heart:  normal S1S2,  tachycardia,  pedal pulses preserved 2/4 bilaterally,  no LE edema  Abdomen:  +BSx4,  ND,  soft,  no guarding,  negative murphy's sign, +mild LLQ tenderness  Muscuoskeletal:  Normal gait and station,   Normal nails and digits bilaterally,  no muscle atrophy  Skin/SubQ:  No jaundice,  warm, dry skin,  no masses or rashes on inspection,   Neurologic:   Sensation grossly intact,  no slurred speech,   No focal deficits  Psychiatric:   Orientated to person, place, and time;  mood and affect unremarkable,  memory recent and remote intact      Labs:     Recent Labs     07/01/17  1050 07/02/17  0446   WBC 10.4 5.8   RBC 5.26 4.60*   HGB 17.4 14.9   HCT 48.6 42.8   MCV 92.4 93.0   MCH 33.1* 32.4*   MCHC 35.8 34.8   PLT 186 150     Recent Labs     07/01/17  1050 07/02/17  0446   NA 136 139   K 4.4 4.3   ANIONGAP 14 13   CL 100 105   CO2 22 21*   BUN 17 13   CREATININE 1.1 1.2   GLUCOSE 114* 98   CALCIUM 9.5 8.9     No results for input(s): MG, PHOS in the last 72  hours.  Recent Labs     07/01/17  1050  07/02/17  0446   AST 24 18   ALT 30 22   BILITOT 0.9 0.6   ALKPHOS 62 44     HgBA1c:  No components found for: HGBA1C  FLP:  No results found for: TRIG, HDL, LDLCALC, LDLDIRECT, LABVLDL  TSH:  No results found for: TSH  Troponin T:   Recent Labs     07/01/17  1050   TROPONINI <0.01     INR:   Recent Labs     07/01/17  1050   INR 1.05       Recent Labs     07/01/17  1050   LIPASE 46       Radiology:  Xr Chest Standard (2 Vw)    Result Date: 07/01/2017  Examination. XR CHEST (2 VW) History: The patient complains of chest pain. The frontal and lateral views of the chest are obtained. There is no previous study for comparison. The lungs are clear and moderately well-expanded. The heart size is in the normal range. There is no bony abnormality.    No active cardiopulmonary disease. The above finding are recorded on a digital voice clip in PACS. Signed by Dr Nila Nephew on 07/01/2017 12:29 PM    US Scrotum And Testicles    Result Date: 07/02/2017  History: 42 year old with testicular pain. Comparison: None. Findings: Real-time exam with grayscale, color, and spectral imaging of the scrotum/testicles was performed. Both testicles. Normal size, contour, and echogenicity. No parenchymal masses. Normal and symmetric blood flow is noted. Testicular waveforms are normal. Right testicular length 4.8 cm. Left testicular length 4.7 cm. No abnormality of either epididymis. No hydrocele or varicocele.    Normal testicular ultrasound. Signed by Dr Elsie Stain on 07/02/2017 3:42 PM    Ct Abdomen Pelvis W Iv Contrast Additional Contrast? None    Result Date: 07/01/2017  EXAMINATION: CT ABDOMEN PELVIS W IV CONTRAST 07/01/2017 12:39 PM HISTORY: Severe lower abdominal pain, blood in stool Comparison: None Technique: Sequential imaging was performed from the lung bases through the pubic symphysis after the administration of IV contrast. Sagittal and coronal reformations were made from the original source data  and reviewed. Automated exposure control was utilized for radiation dose reduction. Radiation dose: DLP 1856 mGy-cm Findings: The visualized heart appears normal in size. The lung bases appear clear. There is mild diffuse fatty infiltration of the liver, which is otherwise unremarkable. The spleen, pancreas, gallbladder, adrenal glands, and kidneys appear grossly normal. The main portal and splenic veins and IVC appear grossly normal. The abdominal aorta appears normal in caliber. The origins of the celiac axis, superior mesenteric artery, and inferior mesenteric artery enhance normally. No pathologically enlarged central mesenteric or retroperitoneal lymph nodes are appreciated. The stomach and small bowel appear normal in caliber. There are numerous scattered colonic diverticula, most numerous in the sigmoid colon. There is questionable mild inflammation adjacent to the sigmoid colon, possibly reflecting early acute diverticulitis. The appendix is normal in appearance. No free air or free fluid is seen in the abdomen. The urinary bladder is partially distended and grossly normal in appearance. No free fluid is seen in the pelvis. No pelvic or inguinal lymphadenopathy is appreciated. Review of the visualized osseous structures demonstrates no acute or aggressive lesions.    Impression: 1. Very minimal inflammatory stranding around the sigmoid colon possibly reflecting very early acute diverticulitis. 2. Hepatic steatosis. Signed by Dr Donnalee Curry on 07/01/2017 12:46 PM    Cta Pulmonary W Contrast    Result Date: 07/02/2017  CTA chest 07/02/2017 11:45 AM HISTORY: Shortness of breath, chest pain, elevated d-dimer TECHNIQUE: Axial images of the chest were obtained following IV contrast. . Coronal and sagittal reformatted images are reconstructed and reviewed. Maximal intensity projectional images also reconstructed and reviewed COMPARISON: None. DLP: 683 mGy cm Automated exposure control was utilized to minimize patient  radiation dose. FINDINGS: No pulmonary emboli are identified. The thoracic aorta is normal in caliber with no aneurysm or dissection. Superior pericardial recess is visualized. The heart is normal in size. There are no pericardial or pleural effusions. No pathologic intrathoracic or axillary lymphadenopathy is visualized. No adrenal nodule seen within the upper abdomen. Motion artifact degrades assessment of the upper abdominal structures. The lungs are clear. There is no consolidation or suspicious nodularity. There is no pneumothorax. There are no endobronchial lesions. There are no focal destructive osseous lesions.    1. No pulmonary emboli. 2. No thoracic aortic aneurysm or dissection. 3. No acute pulmonary consolidation or suspicious nodularity. Signed by Dr Angelique Holm on 07/02/2017 3:58 PM      Assessment:  1. Diverticulitis  2. Hemorrhoids  3. Anxiety  4. Marijuana use    Plan:  - Pt has a previous history of hemorrhoids with bleeding approximately 30yrs ago. He underwent a colonoscopy which identified the hemorrhoids. On the CT abdomen, early diverticulitis was noted and agree with 10 day course of cipro/flagyl. Stool studies pending. With the presence of diverticulitis, would treat with antibiotics and arrange for a colonoscopy in 8-12 weeks for further examination.  - stool softener daily added    Cordella Register, DO

## 2017-07-02 NOTE — Progress Notes (Signed)
[facility]      Patient:    Donald Maddox  Date of Birth:  Dec 08, 1975  Date of Service:  07/02/2017  MRN:    [id number]    Room:   0332/332-01   PCP:    No primary care provider on file.     Chief Complaint:  Abdominal pain    Subjective:  Patient continues to have abdominal pain and diarrhea. He states pain is unchanged. He also complains of shortness of breath, denies chest pain. He complains of testicular pain. He requests something oral for pain, specifically states he is unable to take morphine, dilaudid, fentanyl.    Cumulative Hospital Course:  The patient is a 42 year old male who presented to the hospital with complaints of abdominal pain, diarrhea, and streaks of blood in his stool. He began having fever and chills. Of note, he takes valium and ativan for reported vertigo and anxiety. He has been out of his ativan for several days, but has still been taking his valium.    In the ER, he was found to be tachycardic. Troponin obtained and negative. WBC 10.4. UDS positive for benzodiazepines and cannabinoids.  CXR demonstrated no active cardiopulmonary disease. CT Abdomen and pelvis demonstrated very minimal inflammatory stranding around the sigmoid colon, possibly reflecting very early acute diverticulitis.  He was admitted to the hospital, placed on IV antibiotics.    5/7: He complains of testicular pain and shortness of breath. D dimer ordered. Testicular ultrasound ordered. Placed on telemetry.    Review of Systems:   10 point review of systems is negative except as specifically addressed above.    Objective:   VITALS:  BP 128/79    Pulse 120    Temp 99.3 ??F (37.4 ??C) (Temporal)    Resp 18    Wt 206 lb (93.4 kg)    SpO2 95%   24HR INTAKE/OUTPUT:      Intake/Output Summary (Last 24 hours) at 07/02/2017 1131  Last data filed at 07/02/2017 0454  Gross per 24 hour   Intake 833.33 ml   Output 1100 ml   Net -266.67 ml     General appearance: alert, appears stated age, cooperative and no distress  Head:  Normocephalic, without obvious abnormality, atraumatic  Eyes: conjunctivae/corneas clear. PERRL, EOM's intact.   Ears: normal external ears and nose, throat without exudate  Neck: Supple, no JVD, no tracheal deviation  Lungs: Normal respiratory effort, clear to auscultation bilaterally,no rales or wheezes   Heart: Tachycardic, regular rhythm, S1, S2 normal, no murmur  Abdomen: Soft, TTP bilateral lower quadrants; non-distended, normal bowel sounds no masses, no organomegaly  GU: Penis without erythema or discharge, scrotum without erythema/discoloration/swelling  Extremities: No lower extremity edema,  No erythema, no tenderness to palpation  Skin: Warm and dry.  Neurologic:  No focal deficits noted.  Psychiatric: Alert and oriented, anxious.        Medications:     ??? sodium chloride 100 mL/hr at 07/02/17 0445     ??? ciprofloxacin  500 mg Oral 2 times per day   ??? metroNIDAZOLE  500 mg Intravenous Q8H   ??? aspirin  81 mg Oral Daily   ??? atorvastatin  20 mg Oral Daily   ??? sodium chloride flush  10 mL Intravenous 2 times per day   ??? enoxaparin  40 mg Subcutaneous Daily   ??? folic acid, thiamine, multi-vitamin with vitamin K infusion   Intravenous Daily     diazepam, sodium chloride flush, acetaminophen  DIET CARDIAC;     Lab and other Data:     Recent Labs     07/01/17  1050 07/02/17  0446   WBC 10.4 5.8   HGB 17.4 14.9   PLT 186 150     Recent Labs     07/01/17  1050 07/02/17  0446   NA 136 139   K 4.4 4.3   CL 100 105   CO2 22 21*   BUN 17 13   CREATININE 1.1 1.2   GLUCOSE 114* 98     Recent Labs     07/01/17  1050 07/02/17  0446   AST 24 18   ALT 30 22   BILITOT 0.9 0.6   ALKPHOS 62 44     Troponin T:   Recent Labs     07/01/17  1050   TROPONINI <0.01     Pro-BNP: No results for input(s): BNP in the last 72 hours.  INR:   Recent Labs     07/01/17  1050   INR 1.05     ABGs: No results found for: PHART, PO2ART, PCO2ART  UA:  Recent Labs     07/01/17  1150   COLORU YELLOW   PHUR 6.5   CLARITYU Clear   SPECGRAV 1.016    LEUKOCYTESUR Negative   UROBILINOGEN 0.2   BILIRUBINUR Negative   BLOODU Negative   GLUCOSEU Negative       RAD:   Xr Chest Standard (2 Vw)    Result Date: 07/01/2017  Examination. XR CHEST (2 VW) History: The patient complains of chest pain. The frontal and lateral views of the chest are obtained. There is no previous study for comparison. The lungs are clear and moderately well-expanded. The heart size is in the normal range. There is no bony abnormality.    No active cardiopulmonary disease. The above finding are recorded on a digital voice clip in PACS. Signed by Dr Nila Nephew on 07/01/2017 12:29 PM    Ct Abdomen Pelvis W Iv Contrast Additional Contrast? None    Result Date: 07/01/2017  EXAMINATION: CT ABDOMEN PELVIS W IV CONTRAST 07/01/2017 12:39 PM HISTORY: Severe lower abdominal pain, blood in stool Comparison: None Technique: Sequential imaging was performed from the lung bases through the pubic symphysis after the administration of IV contrast. Sagittal and coronal reformations were made from the original source data and reviewed. Automated exposure control was utilized for radiation dose reduction. Radiation dose: DLP 1856 mGy-cm Findings: The visualized heart appears normal in size. The lung bases appear clear. There is mild diffuse fatty infiltration of the liver, which is otherwise unremarkable. The spleen, pancreas, gallbladder, adrenal glands, and kidneys appear grossly normal. The main portal and splenic veins and IVC appear grossly normal. The abdominal aorta appears normal in caliber. The origins of the celiac axis, superior mesenteric artery, and inferior mesenteric artery enhance normally. No pathologically enlarged central mesenteric or retroperitoneal lymph nodes are appreciated. The stomach and small bowel appear normal in caliber. There are numerous scattered colonic diverticula, most numerous in the sigmoid colon. There is questionable mild inflammation adjacent to the sigmoid colon, possibly  reflecting early acute diverticulitis. The appendix is normal in appearance. No free air or free fluid is seen in the abdomen. The urinary bladder is partially distended and grossly normal in appearance. No free fluid is seen in the pelvis. No pelvic or inguinal lymphadenopathy is appreciated. Review of the visualized osseous structures demonstrates no acute or aggressive lesions.  Impression: 1. Very minimal inflammatory stranding around the sigmoid colon possibly reflecting very early acute diverticulitis. 2. Hepatic steatosis. Signed by Dr Donnalee Curry on 07/01/2017 12:46 PM    Problem List:     Patient Active Problem List    Diagnosis Date Noted   ??? Testicular pain 07/02/2017   ??? Benzodiazepine withdrawal without complication (HCC) 07/01/2017   ??? Diverticulitis 07/01/2017   ??? Hypertension    ??? Vertigo    ??? Anxiety        Assessment and Plan:     Principal Problem:    Diverticulitis  Active Problems:    Benzodiazepine withdrawal without complication (HCC)    Hypertension    Vertigo    Anxiety    Testicular pain  Resolved Problems:    * No resolved hospital problems. *    Diverticulitis - Continue Cipro, Flagyl, IVF's.   Testicular pain - Obtain testicle/scrotum ultrasound. Consider urology consult.  Vertigo - Continue Valium.  Tachycardia - Obtain D dimer, monitor on telemetry, repeat EKG. Resume lopressor. D-Dimer obtained and elevated- Obtain CTA pulmonary. Patient now reports hx of PE on Xarelto, was not noted on initial intake.  HTN - Resume lopressor.    Discussed with attending and nursing staff.    Advance Directive:  Full Code   Antibiotic:  Cipro, Flagyl   DVT Prophylaxis:  Lovenox    Doylene Canard, PA-C  07/02/2017

## 2017-07-03 LAB — CBC
Hematocrit: 45.6 % (ref 42.0–52.0)
Hemoglobin: 15.9 g/dL (ref 14.0–18.0)
MCH: 33.7 pg — ABNORMAL HIGH (ref 27.0–31.0)
MCHC: 34.9 g/dL (ref 33.0–37.0)
MCV: 96.6 fL — ABNORMAL HIGH (ref 80.0–94.0)
MPV: 10.3 fL (ref 9.4–12.4)
Platelets: 145 10*3/uL (ref 130–400)
RBC: 4.72 M/uL (ref 4.70–6.10)
RDW: 12.5 % (ref 11.5–14.5)
WBC: 5 10*3/uL (ref 4.8–10.8)

## 2017-07-03 LAB — BASIC METABOLIC PANEL
Anion Gap: 11 mmol/L (ref 7–19)
BUN: 12 mg/dL (ref 6–20)
CO2: 22 mmol/L (ref 22–29)
Calcium: 8.8 mg/dL (ref 8.6–10.0)
Chloride: 104 mmol/L (ref 98–111)
Creatinine: 1 mg/dL (ref 0.5–1.2)
GFR Non-African American: >60 (ref >60)
Glucose: 129 mg/dL — ABNORMAL HIGH (ref 74–109)
Potassium: 4.2 mmol/L (ref 3.5–5.0)
Sodium: 137 mmol/L (ref 136–145)

## 2017-07-03 MED ORDER — CIPROFLOXACIN HCL 500 MG PO TABS
500 MG | ORAL_TABLET | Freq: Two times a day (BID) | ORAL | 0 refills | Status: AC
Start: 2017-07-03 — End: 2017-07-13

## 2017-07-03 MED ORDER — METRONIDAZOLE 500 MG PO TABS
500 MG | ORAL_TABLET | Freq: Three times a day (TID) | ORAL | 0 refills | Status: AC
Start: 2017-07-03 — End: 2017-07-13

## 2017-07-03 MED ORDER — METRONIDAZOLE 500 MG PO TABS
500 MG | Freq: Three times a day (TID) | ORAL | Status: DC
Start: 2017-07-03 — End: 2017-07-03
  Administered 2017-07-03: 20:00:00 500 mg via ORAL

## 2017-07-03 MED FILL — DIAZEPAM 5 MG PO TABS: 5 mg | ORAL | Qty: 1

## 2017-07-03 MED FILL — LABETALOL HCL 200 MG PO TABS: 200 mg | ORAL | Qty: 1

## 2017-07-03 MED FILL — ATORVASTATIN CALCIUM 20 MG PO TABS: 20 mg | ORAL | Qty: 1

## 2017-07-03 MED FILL — CIPROFLOXACIN HCL 500 MG PO TABS: 500 mg | ORAL | Qty: 1

## 2017-07-03 MED FILL — ASPIRIN 81 MG PO CHEW: 81 mg | ORAL | Qty: 1

## 2017-07-03 MED FILL — METRONIDAZOLE IN NACL 500-0.79 MG/100ML-% IV SOLN: 500-0.79 MG/100ML-% | INTRAVENOUS | Qty: 100

## 2017-07-03 MED FILL — HYDROCODONE-ACETAMINOPHEN 7.5-325 MG PO TABS: 7.5-325 mg | ORAL | Qty: 1

## 2017-07-03 MED FILL — FOLIC ACID 5 MG/ML IJ SOLN: 5 mg/mL | INTRAMUSCULAR | Qty: 0.2

## 2017-07-03 MED FILL — METRONIDAZOLE 500 MG PO TABS: 500 mg | ORAL | Qty: 1

## 2017-07-03 MED FILL — DOCUSATE SODIUM 100 MG PO CAPS: 100 mg | ORAL | Qty: 1

## 2017-07-03 NOTE — Discharge Summary (Signed)
Physician Discharge Summary     Patient ID:  Donald Maddox  [id number]  42 y.o.  [birth year]    Admit date: 07/01/2017    Discharge date and time: 07/03/2017    Admitting Physician: Alexis Goodell, MD     Discharge Physician: Jerel Shepherd MD    Discharge Diagnoses:     Diverticulitis  Hemorrhoids  Anxiety   Marijuana use  HTN    Discharged Condition: good    Indication for Admission: diverticulitis     Hospital Course:       87yr male with PMHx hemorrhoids, anxiety, and HTN presented to the hospital with abdominal pain mainly in LLQ. He states he [place] the [place] in his car and his abdominal pain worsened to a point that he needed to come to the ER. He denied hematemesis, melena, and hematochezia. Of note, he has a history of hemorrhoids and underwent a colonoscopy in [place] approximately 32yrs ago in which hemorrhoids were detected. Here he was treated with IV ab and fluids. He still has moderate pain but is tolerating diet that was changed to full liquid. He is discharged in good condition with 10 day course of cipro and flagyl.           Consults: GI    Significant Diagnostic Studies:     CTA PULMONARY W CONTRAST [[id number]] Resulted: 07/02/17 1558     Order Status: Completed Updated: 07/02/17 1601    Narrative:     CTA chest 07/02/2017 11:45 AM  HISTORY: Shortness of breath, chest pain, elevated d-dimer  TECHNIQUE: Axial images of the chest were obtained following IV  contrast. . Coronal and sagittal reformatted images are reconstructed  and reviewed. Maximal intensity projectional images also reconstructed  and reviewed  COMPARISON: None.   DLP: 683 mGy cm Automated exposure control was utilized to minimize  patient radiation dose.  FINDINGS:   No pulmonary emboli are identified. The thoracic aorta is normal in  caliber with no aneurysm or dissection. Superior pericardial recess is  visualized. The heart is normal in size. There are no pericardial or  pleural effusions.  No pathologic intrathoracic or axillary  lymphadenopathy is visualized.  No adrenal nodule seen within the upper abdomen. Motion artifact  degrades assessment of the upper abdominal structures.  The lungs are clear. There is no consolidation or suspicious  nodularity. There is no pneumothorax. There are no endobronchial  lesions.  There are no focal destructive osseous lesions.    Impression:     1. No pulmonary emboli.  2. No thoracic aortic aneurysm or dissection.  3. No acute pulmonary consolidation or suspicious nodularity.  Signed by Dr Angelique Holm on 07/02/2017 3:58 PM    US SCROTUM AND TESTICLES [[id number]] Resulted: 07/02/17 1542    Order Status: Completed Updated: 07/02/17 1544    Narrative:     History:  42 year old with testicular pain.  Comparison:  None.  Findings:  Real-time exam with grayscale, color, and spectral imaging of the  scrotum/testicles was performed.  Both testicles. Normal size, contour, and echogenicity. No parenchymal  masses. Normal and symmetric blood flow is noted. Testicular waveforms  are normal.  Right testicular length 4.8 cm. Left testicular length 4.7 cm. No  abnormality of either epididymis. No hydrocele or varicocele.    Impression:     Normal testicular ultrasound.  Signed by Dr Elsie Stain on 07/02/2017 3:42 PM    US SCROTUM AND TESTICLES [[id number]]     Order Status:  Canceled     CT ABDOMEN PELVIS W IV CONTRAST Additional Contrast? None [[id number]] Resulted: 07/01/17 1246    Order Status: Completed Updated: 07/01/17 1248    Narrative:     EXAMINATION: CT ABDOMEN PELVIS W IV CONTRAST 07/01/2017 12:39 PM  HISTORY: Severe lower abdominal pain, blood in stool  Comparison: None  Technique: Sequential imaging was performed from the lung bases  through the pubic symphysis after the administration of IV contrast.  Sagittal and coronal reformations were made from the original source  data and reviewed. Automated exposure control was utilized for  radiation dose  reduction.  Radiation dose: DLP 1856 mGy-cm  Findings:  The visualized heart appears normal in size. The lung bases appear  clear.  There is mild diffuse fatty infiltration of the liver, which is  otherwise unremarkable. The spleen, pancreas, gallbladder, adrenal  glands, and kidneys appear grossly normal.  The main portal and splenic veins and IVC appear grossly normal. The  abdominal aorta appears normal in caliber. The origins of the celiac  axis, superior mesenteric artery, and inferior mesenteric artery  enhance normally.  No pathologically enlarged central mesenteric or retroperitoneal lymph  nodes are appreciated.  The stomach and small bowel appear normal in caliber. There are  numerous scattered colonic diverticula, most numerous in the sigmoid  colon. There is questionable mild inflammation adjacent to the sigmoid  colon, possibly reflecting early acute diverticulitis. The appendix is  normal in appearance. No free air or free fluid is seen in the  abdomen.  The urinary bladder is partially distended and grossly normal in  appearance. No free fluid is seen in the pelvis. No pelvic or inguinal  lymphadenopathy is appreciated.  Review of the visualized osseous structures demonstrates no acute or  aggressive lesions.    Impression:     Impression:  1. Very minimal inflammatory stranding around the sigmoid colon  possibly reflecting very early acute diverticulitis.  2. Hepatic steatosis.  Signed by Dr Donnalee Curry on 07/01/2017 12:46 PM    XR CHEST STANDARD (2 VW) [[id number]] Resulted: 07/01/17 1229    Order Status: Completed Specimen: Chest Updated: 07/01/17 1231    Narrative:     Examination. XR CHEST (2 VW)  History: The patient complains of chest pain.  The frontal and lateral views of the chest are obtained. There is no  previous study for comparison.  The lungs are clear and moderately well-expanded. The heart size is in  the normal range. There is no bony abnormality.    Impression:     No  active cardiopulmonary disease.  The above finding are recorded on a digital voice clip in PACS.  Signed by Dr Nila Nephew on 07/01/2017 12:29 PM            Discharge Exam:  BP (!) 155/80   Pulse 101   Temp 98.2 F (36.8 C)   Resp 18   Wt 202 lb 4 oz (91.7 kg)   SpO2 95%         General appearance: alert, appears stated age and cooperative  Skin: Skin color, texture, turgor normal.   HEENT: Head: Normocephalic, no lesions, without obvious abnormality.  Neck: no adenopathy, no carotid bruit, no JVD and supple, symmetrical, trachea midline  Lungs: clear to auscultation bilaterally  Heart: regular rate and rhythm, S1, S2 normal, no murmur, click, rub or gallop  Abdomen: soft, non-tender; bowel sounds normal; no masses,  no organomegaly  Extremities: extremities normal, atraumatic, no cyanosis or edema  Lymphatic: No significant lymph node enlargement papable  Neurologic: Mental status: Alert, oriented, thought content appropriate      Discharge Medications:       Donald Maddox, Donald Maddox   Home Medication Instructions BMW:[id number]    Printed on:07/03/17 1108   Medication Information                      aspirin 81 MG tablet  Take 81 mg by mouth daily             atorvastatin (LIPITOR) 20 MG tablet  Take 20 mg by mouth daily             ciprofloxacin (CIPRO) 500 MG tablet  Take 1 tablet by mouth every 12 hours for 10 days             diazepam (VALIUM) 5 MG tablet  Take 5 mg by mouth 2 times daily as needed for Anxiety.             labetalol (NORMODYNE) 100 MG tablet  Take 100 mg by mouth daily             LORazepam (ATIVAN) 2 MG tablet  Take 2 mg by mouth 2 times daily as needed for Anxiety.             metroNIDAZOLE (FLAGYL) 500 MG tablet  Take 1 tablet by mouth 3 times daily for 10 days                   Discharge Instructions : with MFM physician in 7 days.  Take medications as directed.  Resume activity as tolerated. With Dr Lorenso Courier in one week     Diet: DIET FULL LIQUID; advance slowly as tolerated (when no more  abd pain)    Disposition: Patient is medically stable and will be discharged home       Jerel Shepherd, MD

## 2017-07-03 NOTE — Care Coordination-Inpatient (Signed)
Spoke witt pt re: Donald Maddox.  Pt stated he was brought down here by a friend to work.    On way here,pt developed abd pain and medical issues.  After 1 day here, pt was brought to ER by this person and since then has blocked him so pt was unable to contact him.   Pt was needing assistance getting back home to [facility].  PT states he spent his money paying for gas on he way here. Pt states he never got to work so there fore was not able to make any money.  Pt has no family, but lives with a lady he refers to as  "mom" considering she has been the one that has helped him out over the years .  She is not able to help out financially.    Juan[place] hound bus ticket was purchased through [employer]. Pt scripts for flagyl and cipro was also given to pt via PMF.  Pt will need to dc here via cab(voucher provided[place] [employer] [place].  Has to be there at 9pm tonight.  Nurse, Shanda Bumps, is aware and pt agreeable to this plan as well and appreciative for the assistance.  Electronically signed by Milagros Reap, RN on 07/03/2017 at 1:11 PM

## 2017-07-03 NOTE — Discharge Instructions (Signed)
???   Good nutrition is important when healing from an illness, injury, or surgery.  Follow any nutrition recommendations given to you during your hospital stay.   ??? If you were given an oral nutrition supplement while in the hospital, continue to take this supplement at home.  You can take it with meals, in-between meals, and/or before bedtime. These supplements can be purchased at most local grocery stores, pharmacies, and chain super-stores.   ??? If you have any questions about your diet or nutrition, call the hospital and ask for the dietitian.      Diet: DIET FULL LIQUID; advance slowly as tolerated (when no more abd pain)

## 2017-07-03 NOTE — Discharge Instructions (Signed)
As tolerated

## 2017-07-03 NOTE — Progress Notes (Signed)
Patients belongings packed and patient taken down in wheelchair by transportation to ER entrance to meet [facility] cab. Has cab voucher in hand.

## 2017-07-03 NOTE — Progress Notes (Signed)
Explained to patient that he would need to schedule a follow up appointment with primary care physician in home town for one week from today. Patient verbalized understanding. Electronically signed by Meda Coffee, RN on 07/03/2017 at 11:46 AM

## 2017-07-03 NOTE — Care Coordination-Inpatient (Incomplete)
SW placed call to [phone]; spoke with pharmacy tech re: medication cash pricing through [facility]; Flagyl:500mg  tablet TID x10d: 15.98; and Cipro  tablet q12h for 10d: 12.98.    PMF voucher printed and taken to pharmacy.    SW also completed cab voucher to take Pt to the [employer] bus station tonight:

## 2019-11-22 ENCOUNTER — Emergency Department (HOSPITAL_COMMUNITY): Payer: Medicaid - Out of State

## 2019-11-22 ENCOUNTER — Encounter (HOSPITAL_COMMUNITY): Payer: Self-pay

## 2019-11-22 ENCOUNTER — Other Ambulatory Visit: Payer: Self-pay

## 2019-11-22 ENCOUNTER — Observation Stay (HOSPITAL_COMMUNITY)
Admission: EM | Admit: 2019-11-22 | Discharge: 2019-11-23 | Disposition: A | Discharge status: Home / Self Care | Payer: Medicaid - Out of State | Attending: Emergency Medicine | Admitting: Emergency Medicine

## 2019-11-22 DIAGNOSIS — I10 Essential (primary) hypertension: Secondary | ICD-10-CM | POA: Diagnosis not present

## 2019-11-22 DIAGNOSIS — E669 Obesity, unspecified: Secondary | ICD-10-CM

## 2019-11-22 DIAGNOSIS — R531 Weakness: Secondary | ICD-10-CM

## 2019-11-22 DIAGNOSIS — R079 Chest pain, unspecified: Secondary | ICD-10-CM | POA: Diagnosis not present

## 2019-11-22 DIAGNOSIS — Z20822 Contact with and (suspected) exposure to covid-19: Secondary | ICD-10-CM | POA: Insufficient documentation

## 2019-11-22 DIAGNOSIS — R200 Anesthesia of skin: Secondary | ICD-10-CM | POA: Diagnosis present

## 2019-11-22 DIAGNOSIS — Z91040 Latex allergy status: Secondary | ICD-10-CM | POA: Insufficient documentation

## 2019-11-22 DIAGNOSIS — Z79899 Other long term (current) drug therapy: Secondary | ICD-10-CM | POA: Diagnosis not present

## 2019-11-22 DIAGNOSIS — R569 Unspecified convulsions: Principal | ICD-10-CM

## 2019-11-22 DIAGNOSIS — E66811 Obesity, class 1: Secondary | ICD-10-CM | POA: Diagnosis present

## 2019-11-22 DIAGNOSIS — R799 Abnormal finding of blood chemistry, unspecified: Secondary | ICD-10-CM | POA: Diagnosis present

## 2019-11-22 HISTORY — DX: Essential (primary) hypertension: I10

## 2019-11-22 HISTORY — DX: Obesity, unspecified: E66.9

## 2019-11-22 LAB — COMPREHENSIVE METABOLIC PANEL
ALT: 33 U/L (ref 0–44)
AST: 46 U/L — ABNORMAL HIGH (ref 15–41)
Albumin: 3.8 g/dL (ref 3.5–5.0)
Alkaline Phosphatase: 53 U/L (ref 38–126)
Anion gap: 12 (ref 5–15)
BUN: 6 mg/dL (ref 6–20)
CO2: 21 mmol/L — ABNORMAL LOW (ref 22–32)
Calcium: 9.3 mg/dL (ref 8.9–10.3)
Chloride: 100 mmol/L (ref 98–111)
Creatinine, Ser: 1.05 mg/dL (ref 0.61–1.24)
GFR calc Af Amer: >60 mL/min (ref >60)
GFR calc non Af Amer: >60 mL/min (ref >60)
Glucose, Bld: 102 mg/dL — ABNORMAL HIGH (ref 70–99)
Potassium: 3.9 mmol/L (ref 3.5–5.1)
Sodium: 133 mmol/L — ABNORMAL LOW (ref 135–145)
Total Bilirubin: 0.6 mg/dL (ref 0.3–1.2)
Total Protein: 6.7 g/dL (ref 6.5–8.1)

## 2019-11-22 LAB — CBC WITH DIFFERENTIAL/PLATELET
Abs Immature Granulocytes: 0.02 10*3/uL (ref 0.00–0.07)
Basophils Absolute: 0 10*3/uL (ref 0.0–0.1)
Basophils Relative: 0 %
Eosinophils Absolute: 0.2 10*3/uL (ref 0.0–0.5)
Eosinophils Relative: 3 %
HCT: 46.8 % (ref 39.0–52.0)
Hemoglobin: 16.3 g/dL (ref 13.0–17.0)
Immature Granulocytes: 0 %
Lymphocytes Relative: 22 %
Lymphs Abs: 1.6 10*3/uL (ref 0.7–4.0)
MCH: 32.4 pg (ref 26.0–34.0)
MCHC: 34.8 g/dL (ref 30.0–36.0)
MCV: 93 fL (ref 80.0–100.0)
Monocytes Absolute: 0.7 10*3/uL (ref 0.1–1.0)
Monocytes Relative: 10 %
Neutro Abs: 4.8 10*3/uL (ref 1.7–7.7)
Neutrophils Relative %: 65 %
Platelets: 239 10*3/uL (ref 150–400)
RBC: 5.03 MIL/uL (ref 4.22–5.81)
RDW: 12.2 % (ref 11.5–15.5)
WBC: 7.4 10*3/uL (ref 4.0–10.5)
nRBC: 0 % (ref 0.0–0.2)

## 2019-11-22 LAB — APTT: aPTT: 28 seconds (ref 24–36)

## 2019-11-22 LAB — PROTIME-INR
INR: 1 (ref 0.8–1.2)
Prothrombin Time: 12.8 seconds (ref 11.4–15.2)

## 2019-11-22 LAB — CBG MONITORING, ED: Glucose-Capillary: 134 mg/dL — ABNORMAL HIGH (ref 70–99)

## 2019-11-22 MED ORDER — ACETAMINOPHEN 325 MG PO TABS
650.0000 mg | ORAL_TABLET | Freq: Four times a day (QID) | ORAL | Status: DC | PRN
  Administered 2019-11-23: 650 mg via ORAL
  Filled 2019-11-22: qty 2

## 2019-11-22 MED ORDER — DIPHENHYDRAMINE HCL 25 MG PO CAPS
50.0000 mg | ORAL_CAPSULE | Freq: Once | ORAL | Status: DC
  Filled 2019-11-22: qty 2

## 2019-11-22 MED ORDER — STROKE: EARLY STAGES OF RECOVERY BOOK: Freq: Once | Status: DC

## 2019-11-22 MED ORDER — ONDANSETRON HCL 4 MG/2ML IJ SOLN: 4.0000 mg | Freq: Four times a day (QID) | INTRAMUSCULAR | Status: DC | PRN

## 2019-11-22 MED ORDER — ONDANSETRON HCL 4 MG PO TABS: 4.0000 mg | ORAL_TABLET | Freq: Four times a day (QID) | ORAL | Status: DC | PRN

## 2019-11-22 MED ORDER — ACETAMINOPHEN 650 MG RE SUPP: 650.0000 mg | Freq: Four times a day (QID) | RECTAL | Status: DC | PRN

## 2019-11-22 MED ORDER — ASPIRIN 81 MG PO CHEW
81.0000 mg | CHEWABLE_TABLET | Freq: Every day | ORAL | Status: DC
  Administered 2019-11-23: 81 mg via ORAL
  Filled 2019-11-22: qty 1

## 2019-11-22 MED ORDER — SODIUM CHLORIDE 0.9% FLUSH
3.0000 mL | Freq: Once | INTRAVENOUS | Status: AC
  Administered 2019-11-22: 3 mL via INTRAVENOUS

## 2019-11-22 MED ORDER — ASPIRIN 325 MG PO TABS
650.0000 mg | ORAL_TABLET | Freq: Once | ORAL | Status: DC
  Filled 2019-11-22: qty 2

## 2019-11-22 MED ORDER — IOHEXOL 350 MG/ML SOLN
100.0000 mL | Freq: Once | INTRAVENOUS | Status: AC | PRN
  Administered 2019-11-22: 100 mL via INTRAVENOUS

## 2019-11-22 MED ORDER — DIPHENHYDRAMINE HCL 50 MG/ML IJ SOLN
25.0000 mg | Freq: Once | INTRAMUSCULAR | Status: AC
  Administered 2019-11-22: 25 mg via INTRAVENOUS
  Filled 2019-11-22: qty 1

## 2019-11-22 MED ORDER — LORAZEPAM 2 MG/ML IJ SOLN
INTRAMUSCULAR | Status: AC
  Filled 2019-11-22: qty 2

## 2019-11-22 NOTE — ED Notes (Signed)
Pt began having a seizure while inside the CT Scanner. EDP Lovenia Kim, MD at bedside. Verbal Order for 2 mg of Ativan IV given and administrated.

## 2019-11-22 NOTE — Consult Note (Signed)
Referring Physician: Dr. Bernette Mayers    Chief Complaint: Acute onset of right sided weakness and numbness  HPI: Jerry Mills is an 44 y.o. male with history of PE on Xarelto, which was recently stopped by his physician 1 week ago, who [place] ac[place] dep[place]   The patient had called 911 for acute onset of right sided numbness involving his face, arm and leg - he stated his location was [place] some bushes at the [employer] bus terminal. On arrival by EMS he was lyging on his right side and on the phone with 911. EMS tried to walk him and he collapsed towards his right side. EMS pulled him out and brought him to the truck where they noted right sided weakness, right facial droop. Eyes were normal appearing. He was not exhibiting seizure activity. Had c/o blurred and double vision in his right eye. 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 Speech was normal.   Some discrepancies were noted by EMS. His tongue fell to the left instead of the right per EMS. He was complaining of a "stroke" as well as chest pain. He was asking for nitroglycerin. 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 Here in the ED, he is now requesting Benadryl for an allergic reaction to the weeds he was lying in.   Per EMS, his CBG was 122, BP was 220/120 >> 196/118, afebrile, RR normal, HR 130 with sinus tach on the rhythm strip.   Per patient he came from [place] to visit a male friend, traveling by bus. When he got here he states she did not pick him up, so he crawled into some bushes by the bus stop. Subsequently, his right sided symptoms started.   The patient states that, in addition to his history of PE, he has HTN, anxiety and PTSD. He has not taken any of his meds for the past week: Labetalol, Lorazepam, Klonopin, and ASA. Per Pharmacy, he recently filled an rx for 5 diazepam, Norco and labetalol. Some were filled in [place] another  in [place]. No Xarelto has been filled since March.   LSN: [id number] tPA Given: No: Overall presentation is most consistent with functional etiology. Risks of tPA significantly outweigh benefits. CTA shows no LVO and MRI confirms no acute stroke.   PMHx: HTN PTSD Anxiety PE Seizures   No family history on file. Social History:  has no history on file for tobacco use, alcohol use, and drug use.  Allergies: Not on File  Medications: See HPI  ROS: As per HPI. Detailed ROS deferred in the context of acuity of presentation.   Physical Examination: There were no vitals taken for this visit.  HEENT: [place]/AT Lungs: Gasping respirations with anxious affect, alternate with unlabored breathing Ext: No edema  Neurologic Examination: Mental Status: Exam changes rapidly during evaluation at the bridge and at CT: Initially distressed with gasping respirations and tremulousness at the bridge. In CT speech was fluent with intact comprehension. He was partially oriented to time and place. He could give details of his recent travel to [place]. He was agitated and tearful at times. Overall labile affect throughout the evaluation. In CT he had a seizure-like spell lasting about 30 seconds followed by no postictal state.  Cranial Nerves: II:  Severely decreased acuity in left eye (states chronic since birth). Temporal visual field cut on testing of right eye.  PERRL.   III,IV, VI: Ptosis not present. EOMI. No nystagmus V,VII: Right sided facial droop on arrival, which resolved completely in CT. FT sensation decreased to right side of face.  VIII: Hearing intact to voice IX,X: No hypophonia XI: Decreased shoulder shrug on the right XII: Midline tongue extension  Motor: RUE with 2-3/5 strength that waxes and wanes. There is some effort dependence.  RLE with 2-3/5 strength that waxes and wanes. Some effort dependence noted. Giveway weakness also noted.  LUE and LLE: 5/5 Sensory: Decreased FT to RUE and RLE.   Deep Tendon Reflexes:  2+ bilateral brachioradialis and patellae.  Plantars: Mute bilaterally  Cerebellar: No ataxia with FNF on the left. Does not perform on the right.  Gait: Unable to assess   No results found for this or any previous visit (from the past 48 hour(s)). No results found.  Assessment: 44 y.o. male presenting with acute onset of right hemiparesis, right facial droop, right hemisensory loss. Also with seizure versus pseudoseizure at CT.  1. Exam shows right sided weakness with functional components including waxing/waning strength and giveway weakness. Also with right sided hypoesthesia. Right facial droop at the bridge has resolved.  2. CT head without acute change. A hypodensity in the right corona radiata may represent a dilated V-R space versus neuroglial cyst versus old small vessel ischemic infarction.  3. CTA of head and neck with no LVO or significant stenosis.  4. Stroke Risk Factors - HTN, history of PE 5. Stated history of seizures. He exhibited 30 seconds of seizure like activity in CT without postictal state. DDx seizure versus pseudoseizure. States he is on Lorazepam and Klonopin for seizures, but there are some inconsistencies with the medications pulled up on database by Pharmacy.   Recommendations: 1. STAT MRI brain (ordered) 2. HgbA1c, fasting lipid panel 3. TTE 4. PT consult, OT consult, Speech consult 5. Cardiac telemetry 6. Start ASA 81 mg after initial loading dose of 650 mg po x 1 (ordered).  7. Will need Case Management consult as patient may be homeless. 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  8. Frequent neuro checks 9. Permissive HTN x 24 hours.  10. EEG (ordered) 11. Per [facility] statutes, patients with seizures are not allowed to drive until  they have been seizure-free for six months. Use caution when using heavy equipment or power tools. Avoid working on ladders or at heights. Take showers instead of baths. Ensure the water temperature is not too  high on the home water heater. Do not go swimming alone. When caring for infants or small children, sit down when holding, feeding, or changing them to minimize risk of injury to the child in the event you have a seizure. Also, Maintain good sleep hygiene. Avoid alcohol.  Addendum: MRI brain: 1. No acute intracranial abnormality. 2. 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  @Electronically  signed: Dr.  11/22/2019, 7:55 PM

## 2019-11-22 NOTE — ED Notes (Signed)
Pt transported to MRI 

## 2019-11-22 NOTE — H&P (Signed)
History and Physical    Jerry Mills KGY:[id number] DOB: [birth year] DOA: 11/22/2019  PCP: Patient, No Pcp Per   Patient coming from: Home.  I have personally briefly reviewed patient's old medical records in [facility] Link  Chief Complaint: Right-sided weakness.  HPI: Jerry Mills is a 44 y.o. male with medical history significant of pulmonary embolus and off Xarelto since March, PTSD, anxiety, class I obesity, hypertension who is coming to the emergency department via EMS after 911 was called due to the patient developing acute right-sided weakness with right facial droop at the local [employer] bus terminal.  He arrived earlier from [place] to visit a male friend, but states that when he arrived to the station she did not pick them up.  He was waiting around some bushes in the bus station when he developed right-sided weakness.  He apparently had a seizure here and was given Ativan.  He is currently sedated and unable to provide further information.  Seeing neurology and need EP notes for further history and details.  ED Course: Initial vital signs were temperature 98.4 F, pulse 118, respirations 16, BP 141/100 mmHg O2 sat 98% on room air.  The patient received 25 mg right eye pheniramine the emergency department after he complained that he may have has some skin itching due to allergic reactions to the leaves of the bushes he was around bus station.  He also received 2 mg of lorazepam IVP after apparently having a seizure episode in the MRI suite.  CBC and coagulation parameters were normal.  Sodium 133 and CO2 21 mmol/L.  Glucose 102 mg/dL, but this is a nonfasting level.  AST is slightly elevated at 46 units/L.  The rest of the chemistry is within normal range.  Imaging: CT head code stroke was concerning for right corona radiata hypodensity.  CTA head and neck did not find any large vessel occlusion.  MRI brain did not see any acute infarct.  However there was mild to moderate  chronic microvascular ischemic disease with a remote lacunar infarct the anterior right basal ganglia/corona radiata.  Please see images and full regular report for further detail.  Review of Systems: As per HPI otherwise all other systems reviewed and are negative.  Past Medical History:  Diagnosis Date  . Class 1 obesity 11/22/2019  . Hypertension 11/22/2019    History reviewed. No pertinent surgical history.  Social History  has no history on file for tobacco use, alcohol use, and drug use.  Allergies  Allergen Reactions  . Latex Hives and Rash   The patient is sedated at this time: UNABLE TO OBTAIN FAMILY HISTORY  Prior to Admission medications   Medication Sig Start Date End Date Taking? Authorizing Provider  ibuprofen (ADVIL) 200 MG tablet Take 200 mg by mouth every 6 (six) hours as needed for fever or moderate pain.   Yes [provider]  labetalol (NORMODYNE) 100 MG tablet Take 50 mg by mouth daily. 07/04/19  Yes [provider]  LORazepam (ATIVAN) 1 MG tablet Take 1 mg by mouth 2 (two) times daily. 07/04/19  Yes [provider]   Physical Exam: Vitals:   11/22/19 2100 11/22/19 2115 11/22/19 2130 11/22/19 2209  BP: (!) 132/91 (!) 144/105 (!) 140/99 (!) 158/88  Pulse: (!) 111 (!) 115 (!) 105 (!) 112  Resp: (!) 24 14 (!) 23 16  Temp:      TempSrc:      SpO2: 96% 97% 97% 99%  Weight:  Height:       Constitutional: Somnolent.  NAD, calm, comfortable Eyes: PERRL, lids and conjunctivae normal ENMT: Mucous membranes are moist. Posterior pharynx clear of any exudate or lesions. Neck: normal, supple, no masses, no thyromegaly Respiratory: clear to auscultation bilaterally, no wheezing, no crackles. Normal respiratory effort. No accessory muscle use.  Cardiovascular: Regular rate and rhythm, no murmurs / rubs / gallops. No extremity edema. 2+ pedal pulses. No carotid bruits.  Abdomen: Nondistended.  BS positive.  Soft, no tenderness, no masses  palpated. No hepatosplenomegaly. Musculoskeletal: no clubbing / cyanosis.  Good ROM, no contractures. Normal muscle tone.  Skin: no gross rashes, lesions, ulcers on very limited dermatological examination. Neurologic: Somnolent/sedated.  Unable to fully evaluate.  Right-sided facial droop seems to be less evident at this time.  Please see Dr. Otelia LimesLindzen notes for further details on awake examination. Psychiatric: Somnolent.  Wakes up briefly with verbal stimuli, but falls back to sleep within seconds.  Labs on Admission: I have personally reviewed following labs and imaging studies  CBC: Recent Labs  Lab 11/22/19 2010 11/22/19 2100  WBC  --  7.4  NEUTROABS  --  4.8  HGB 16.7 16.3  HCT 49.0 46.8  MCV  --  93.0  PLT  --  239    Basic Metabolic Panel: Recent Labs  Lab 11/22/19 2010 11/22/19 2117  NA 132* 133*  K 6.6* 3.9  CL 103 100  CO2  --  21*  GLUCOSE 128* 102*  BUN 13 6  CREATININE 1.30* 1.05  CALCIUM  --  9.3    GFR: Estimated Creatinine Clearance: 99.8 mL/min (by C-G formula based on SCr of 1.05 mg/dL).  Liver Function Tests: Recent Labs  Lab 11/22/19 2117  AST 46*  ALT 33  ALKPHOS 53  BILITOT 0.6  PROT 6.7  ALBUMIN 3.8    Urine analysis: No results found for: COLORURINE, APPEARANCEUR, LABSPEC, PHURINE, GLUCOSEU, HGBUR, BILIRUBINUR, KETONESUR, PROTEINUR, UROBILINOGEN, NITRITE, LEUKOCYTESUR  Radiological Exams on Admission: CT Angio Chest/Abd/Pel for Dissection W and/or W/WO  Result Date: 11/22/2019 CLINICAL DATA:  Stroke follow-up. Chest pain. Concern for dissection. EXAM: CT ANGIOGRAPHY CHEST, ABDOMEN AND PELVIS TECHNIQUE: Non-contrast CT of the chest was initially obtained. Multidetector CT imaging through the chest, abdomen and pelvis was performed using the standard protocol during bolus administration of intravenous contrast. Multiplanar reconstructed images and MIPs were obtained and reviewed to evaluate the vascular anatomy. CONTRAST:  100mL OMNIPAQUE  IOHEXOL 350 MG/ML SOLN COMPARISON:  None. FINDINGS: CTA CHEST FINDINGS Cardiovascular: There is no evidence for thoracic aortic dissection or aneurysm. There is no large centrally located pulmonary embolism. The heart size is unremarkable. There is no significant pericardial effusion. Mediastinum/Nodes: -- No mediastinal lymphadenopathy. -- No hilar lymphadenopathy. -- No axillary lymphadenopathy. -- No supraclavicular lymphadenopathy. -- Normal thyroid gland where visualized. -  Unremarkable esophagus. Lungs/Pleura: Airways are patent. No pleural effusion, lobar consolidation, pneumothorax or pulmonary infarction. Musculoskeletal: No chest wall abnormality. No bony spinal canal stenosis. Review of the MIP images confirms the above findings. CTA ABDOMEN AND PELVIS FINDINGS VASCULAR Aorta: Normal caliber aorta without aneurysm, dissection, vasculitis or significant stenosis. Celiac: Patent without evidence of aneurysm, dissection, vasculitis or significant stenosis. SMA: Patent without evidence of aneurysm, dissection, vasculitis or significant stenosis. Renals: Both renal arteries are patent without evidence of aneurysm, dissection, vasculitis, fibromuscular dysplasia or significant stenosis. IMA: Patent without evidence of aneurysm, dissection, vasculitis or significant stenosis. Inflow: Patent without evidence of aneurysm, dissection, vasculitis or significant stenosis. Veins: No obvious  venous abnormality within the limitations of this arterial phase study. Review of the MIP images confirms the above findings. NON-VASCULAR Hepatobiliary: The liver is normal. Normal gallbladder.There is no biliary ductal dilation. Pancreas: Normal contours without ductal dilatation. No peripancreatic fluid collection. Spleen: Unremarkable. Adrenals/Urinary Tract: --Adrenal glands: Unremarkable. --Right kidney/ureter: No hydronephrosis or radiopaque kidney stones. --Left kidney/ureter: No hydronephrosis or radiopaque kidney stones.  --Urinary bladder: Unremarkable. Stomach/Bowel: --Stomach/Duodenum: No hiatal hernia or other gastric abnormality. Normal duodenal course and caliber. --Small bowel: Unremarkable. --Colon: There is scattered colonic diverticula without CT evidence for diverticulitis. --Appendix: Normal. Lymphatic: --No retroperitoneal lymphadenopathy. --No mesenteric lymphadenopathy. --No pelvic or inguinal lymphadenopathy. Reproductive: Unremarkable Other: No ascites or free air. There is a small fat containing right inguinal hernia. Musculoskeletal. No acute displaced fractures. Review of the MIP images confirms the above findings. IMPRESSION: 1. No acute thoracic, abdominal or pelvic pathology. Specifically, there is no evidence for aortic dissection or aneurysm. 2. Small fat containing right inguinal hernia. 3. Scattered colonic diverticula without CT evidence for diverticulitis. Electronically Signed   By: Katherine Mantle M.D.   On: 11/22/2019 20:57   CT HEAD CODE STROKE WO CONTRAST  Result Date: 11/22/2019 CLINICAL DATA:  Code stroke.  Right-sided weakness. EXAM: CT HEAD WITHOUT CONTRAST TECHNIQUE: Contiguous axial images were obtained from the base of the skull through the vertex without intravenous contrast. COMPARISON:  None. FINDINGS: Brain: Focal hypodensities involving the right corona radiata. No intracranial hemorrhage. No mass lesion. No midline shift, ventriculomegaly or extra-axial fluid collection. Vascular: No hyperdense vessel or unexpected calcification. Skull: Negative for fracture or focal lesion. Sinuses/Orbits: Normal orbits. Mild ethmoid sinus mucosal thickening. No mastoid effusion. Other: None. ASPECTS [facility] Stroke Program Early CT Score) - Ganglionic level infarction (caudate, lentiform nuclei, internal capsule, insula, M1-M3 cortex): 7 - Supraganglionic infarction (M4-M6 cortex): 2 Total score (0-10 with 10 being normal): 9 IMPRESSION: 1. Right corona radiata hypodensities concerning for  subacute infarct. 2. ASPECTS is 9 3. Code stroke imaging results were communicated on 11/22/2019 at 8:18 pm to provider Dr. Otelia Limes via secure text paging. Electronically Signed   By: Stana Bunting M.D.   On: 11/22/2019 20:20   CT ANGIO HEAD CODE STROKE  Result Date: 11/22/2019 CLINICAL DATA:  Neuro deficit EXAM: CT ANGIOGRAPHY HEAD AND NECK TECHNIQUE: Multidetector CT imaging of the head and neck was performed using the standard protocol during bolus administration of intravenous contrast. Multiplanar CT image reconstructions and MIPs were obtained to evaluate the vascular anatomy. Carotid stenosis measurements (when applicable) are obtained utilizing NASCET criteria, using the distal internal carotid diameter as the denominator. CONTRAST:  OMNIPAQUE IOHEXOL 350 MG/ML SOLN COMPARISON:  11/22/2019 head CT. FINDINGS: CTA NECK FINDINGS Aortic arch: Standard branching. Imaged portion shows no evidence of aneurysm or dissection. No significant stenosis of the major arch vessel origins. Right carotid system: No evidence of dissection, stenosis (50% or greater) or occlusion. Left carotid system: No evidence of dissection, stenosis (50% or greater) or occlusion. Vertebral arteries: Codominant. No evidence of dissection, stenosis (50% or greater) or occlusion. Skeleton: No acute or suspicious osseous abnormalities. Other neck: No adenopathy.  No soft tissue mass. Upper chest: Dependent atelectasis. Review of the MIP images confirms the above findings CTA HEAD FINDINGS Anterior circulation: No significant stenosis, proximal occlusion, aneurysm, or vascular malformation. Posterior circulation: No significant stenosis, proximal occlusion, aneurysm, or vascular malformation. Venous sinuses: As permitted by contrast timing, patent. Anatomic variants: None of significance. Review of the MIP images confirms the above findings IMPRESSION:  No large vessel occlusion, high-grade narrowing, dissection or aneurysm. These  results were called by telephone at the time of interpretation on 11/22/2019 at 8:57 Pm to provider ERIC [facility] , who verbally acknowledged these results. Electronically Signed   By: Stana Bunting M.D.   On: 11/22/2019 21:05   CT ANGIO NECK CODE STROKE  Result Date: 11/22/2019 CLINICAL DATA:  Neuro deficit EXAM: CT ANGIOGRAPHY HEAD AND NECK TECHNIQUE: Multidetector CT imaging of the head and neck was performed using the standard protocol during bolus administration of intravenous contrast. Multiplanar CT image reconstructions and MIPs were obtained to evaluate the vascular anatomy. Carotid stenosis measurements (when applicable) are obtained utilizing NASCET criteria, using the distal internal carotid diameter as the denominator. CONTRAST:  OMNIPAQUE IOHEXOL 350 MG/ML SOLN COMPARISON:  11/22/2019 head CT. FINDINGS: CTA NECK FINDINGS Aortic arch: Standard branching. Imaged portion shows no evidence of aneurysm or dissection. No significant stenosis of the major arch vessel origins. Right carotid system: No evidence of dissection, stenosis (50% or greater) or occlusion. Left carotid system: No evidence of dissection, stenosis (50% or greater) or occlusion. Vertebral arteries: Codominant. No evidence of dissection, stenosis (50% or greater) or occlusion. Skeleton: No acute or suspicious osseous abnormalities. Other neck: No adenopathy.  No soft tissue mass. Upper chest: Dependent atelectasis. Review of the MIP images confirms the above findings CTA HEAD FINDINGS Anterior circulation: No significant stenosis, proximal occlusion, aneurysm, or vascular malformation. Posterior circulation: No significant stenosis, proximal occlusion, aneurysm, or vascular malformation. Venous sinuses: As permitted by contrast timing, patent. Anatomic variants: None of significance. Review of the MIP images confirms the above findings IMPRESSION: No large vessel occlusion, high-grade narrowing, dissection or aneurysm. These  results were called by telephone at the time of interpretation on 11/22/2019 at 8:57 Pm to provider ERIC [facility] , who verbally acknowledged these results. Electronically Signed   By: Stana Bunting M.D.   On: 11/22/2019 21:05    EKG: Independently reviewed.  Vent. rate 113 BPM PR interval * ms QRS duration 83 ms QT/QTc 316/434 ms P-R-T axes 74 0 77 Sinus tachycardia LAE, consider biatrial enlargement Indeterminate axis  Assessment/Plan Principal Problem:   Acute right-sided weakness Observation/telemetry. Frequent neurochecks. Swallow screen. PT/OT/SLP. Check fasting lipids and hemoglobin A1c. Check echocardiogram. Continue aspirin. Allow permissive hypertension. Neuro hospitalist team is following.  Active Problems:   Hypertension Allow permissive hypertension. Monitor blood pressure closely.    Seizure (HCC) EEG will be done tomorrow.    Abnormal blood chemistry Mildly low sodium 133 mmol/L. Mildly low CO2 at 21 mmol/L. Mildly elevated AST of 46 units/L. Repeat CMP in a.m.    Class 1 obesity Lifestyle modifications. Follow-up with PCP.   DVT prophylaxis: SCDs. Code Status:   Full code. Family Communication: Disposition Plan:   Patient is from:  [place] (homeless?).  Anticipated DC to:  TBD.  Anticipated DC date:  11/23/2019 or 11/24/2019.  Anticipated DC barriers: Clinical status, pending work-up and social issues.  Consults called:  Neuro hospitalist service. Admission status:  Observation/telemetry.   Severity of Illness: High due to new onset right-sided hemiparesis on the patient with a history of remote CVA, also pulmonary embolism off Xarelto at this time.  He will be observed for neuro checks and further work-up.   Bobette Mo MD [facility]  How to contact the [facility] Attending or Consulting provider 7A - 7P or covering provider during after hours 7P -7A, for this patient?   1. Check the care team in [facility] and  look for a)  attending/consulting [facility] provider listed and b) the [facility] team listed 2. Log into [email or web address] and use [facility]'s universal password to access. If you do not have the password, please contact the hospital operator. 3. Locate the [facility] provider you are looking for under [facility] and page to a number that you can be directly reached. 4. If you still have difficulty reaching the provider, please page the [facility] (Director on Call) for the Hospitalists listed on amion for assistance.  11/22/2019, 11:27 PM   This document was prepared using Dragon voice recognition software and may contain some unintended transcription errors.

## 2019-11-22 NOTE — ED Provider Notes (Signed)
[facility] EMERGENCY DEPARTMENT Provider Note  CSN: [id number] Arrival date & time: 11/22/19 1951    History Chief Complaint  Patient presents with  . Code Stroke  . Seizures    HPI  Jerry Mills is a 44 y.o. male with history of PE recently taken off of Xarelto by PCP. He reports onset of R sided numbness around 1700 this evening. EMS found him to have facial droop and R sided weakness. Code Stroke was initiated.    History reviewed. No pertinent past medical history.   No family history on file.  Social History   Tobacco Use  . Smoking status: Not on file  Substance Use Topics  . Alcohol use: Not on file  . Drug use: Not on file     Home Medications Prior to Admission medications   Medication Sig Start Date End Date Taking? Authorizing Provider  ibuprofen (ADVIL) 200 MG tablet Take 200 mg by mouth every 6 (six) hours as needed for fever or moderate pain.   Yes [provider]  labetalol (NORMODYNE) 100 MG tablet Take 50 mg by mouth daily. 07/04/19  Yes [provider]  LORazepam (ATIVAN) 1 MG tablet Take 1 mg by mouth 2 (two) times daily. 07/04/19  Yes [provider]     Allergies    Latex   Review of Systems   Review of Systems Unable to assess due to acuity of condition.    Physical Exam BP (!) 158/88 (BP Location: Right Arm)   Pulse (!) 112   Temp 98.4 F (36.9 C) (Oral)   Resp 16   Ht 5\' 8"  (1.727 m)   Wt 91.9 kg   SpO2 99%   BMI 30.81 kg/m   Physical Exam Vitals and nursing note reviewed.  Constitutional:      Appearance: Normal appearance.  HENT:     Head: Normocephalic and atraumatic.     Nose: Nose normal.     Mouth/Throat:     Mouth: Mucous membranes are moist.  Eyes:     Extraocular Movements: Extraocular movements intact.     Conjunctiva/sclera: Conjunctivae normal.  Cardiovascular:     Rate and Rhythm: Normal rate.  Pulmonary:     Effort: Pulmonary effort is normal.     Breath sounds: Normal  breath sounds.  Abdominal:     General: Abdomen is flat.     Palpations: Abdomen is soft.     Tenderness: There is no abdominal tenderness.  Musculoskeletal:        General: No swelling. Normal range of motion.     Cervical back: Neck supple.  Skin:    General: Skin is warm and dry.  Neurological:     Mental Status: He is alert.     Cranial Nerves: Cranial nerve deficit (R facial droop) present.     Sensory: Sensory deficit (sensory deficit on R arm and leg) present.     Motor: Weakness (unable to lift R arm or leg) present.     Comments: For full NIHSS, please see Neurology notes; tremulous      ED Results / Procedures / Treatments   Labs (all labs ordered are listed, but only abnormal results are displayed) Labs Reviewed  COMPREHENSIVE METABOLIC PANEL - Abnormal; Notable for the following components:      Result Value   Sodium 133 (*)    CO2 21 (*)    Glucose, Bld 102 (*)    AST 46 (*)    All other components  within normal limits  I-STAT CHEM 8, ED - Abnormal; Notable for the following components:   Sodium 132 (*)    Potassium 6.6 (*)    Creatinine, Ser 1.30 (*)    Glucose, Bld 128 (*)    Calcium, Ion 1.01 (*)    All other components within normal limits  CBG MONITORING, ED - Abnormal; Notable for the following components:   Glucose-Capillary 134 (*)    All other components within normal limits  RESPIRATORY PANEL BY RT PCR (FLU A&B, COVID)  CBC WITH DIFFERENTIAL/PLATELET  PROTIME-INR  APTT    EKG EKG Interpretation  Date/Time:  Sunday November 22 2019 20:54:16 EDT Ventricular Rate:  113 PR Interval:    QRS Duration: 83 QT Interval:  316 QTC Calculation: 434 R Axis:   0 Text Interpretation: Sinus tachycardia LAE, consider biatrial enlargement Indeterminate axis No old tracing to compare Confirmed by Susy Frizzle [phone]) on 11/22/2019 9:02:54 PM   Radiology CT Angio Chest/Abd/Pel for Dissection W and/or W/WO  Result Date: 11/22/2019 CLINICAL DATA:   Stroke follow-up. Chest pain. Concern for dissection. EXAM: CT ANGIOGRAPHY CHEST, ABDOMEN AND PELVIS TECHNIQUE: Non-contrast CT of the chest was initially obtained. Multidetector CT imaging through the chest, abdomen and pelvis was performed using the standard protocol during bolus administration of intravenous contrast. Multiplanar reconstructed images and MIPs were obtained and reviewed to evaluate the vascular anatomy. CONTRAST:  OMNIPAQUE IOHEXOL 350 MG/ML SOLN COMPARISON:  None. FINDINGS: CTA CHEST FINDINGS Cardiovascular: There is no evidence for thoracic aortic dissection or aneurysm. There is no large centrally located pulmonary embolism. The heart size is unremarkable. There is no significant pericardial effusion. Mediastinum/Nodes: -- No mediastinal lymphadenopathy. -- No hilar lymphadenopathy. -- No axillary lymphadenopathy. -- No supraclavicular lymphadenopathy. -- Normal thyroid gland where visualized. -  Unremarkable esophagus. Lungs/Pleura: Airways are patent. No pleural effusion, lobar consolidation, pneumothorax or pulmonary infarction. Musculoskeletal: No chest wall abnormality. No bony spinal canal stenosis. Review of the MIP images confirms the above findings. CTA ABDOMEN AND PELVIS FINDINGS VASCULAR Aorta: Normal caliber aorta without aneurysm, dissection, vasculitis or significant stenosis. Celiac: Patent without evidence of aneurysm, dissection, vasculitis or significant stenosis. SMA: Patent without evidence of aneurysm, dissection, vasculitis or significant stenosis. Renals: Both renal arteries are patent without evidence of aneurysm, dissection, vasculitis, fibromuscular dysplasia or significant stenosis. IMA: Patent without evidence of aneurysm, dissection, vasculitis or significant stenosis. Inflow: Patent without evidence of aneurysm, dissection, vasculitis or significant stenosis. Veins: No obvious venous abnormality within the limitations of this arterial phase study. Review of  the MIP images confirms the above findings. NON-VASCULAR Hepatobiliary: The liver is normal. Normal gallbladder.There is no biliary ductal dilation. Pancreas: Normal contours without ductal dilatation. No peripancreatic fluid collection. Spleen: Unremarkable. Adrenals/Urinary Tract: --Adrenal glands: Unremarkable. --Right kidney/ureter: No hydronephrosis or radiopaque kidney stones. --Left kidney/ureter: No hydronephrosis or radiopaque kidney stones. --Urinary bladder: Unremarkable. Stomach/Bowel: --Stomach/Duodenum: No hiatal hernia or other gastric abnormality. Normal duodenal course and caliber. --Small bowel: Unremarkable. --Colon: There is scattered colonic diverticula without CT evidence for diverticulitis. --Appendix: Normal. Lymphatic: --No retroperitoneal lymphadenopathy. --No mesenteric lymphadenopathy. --No pelvic or inguinal lymphadenopathy. Reproductive: Unremarkable Other: No ascites or free air. There is a small fat containing right inguinal hernia. Musculoskeletal. No acute displaced fractures. Review of the MIP images confirms the above findings. IMPRESSION: 1. No acute thoracic, abdominal or pelvic pathology. Specifically, there is no evidence for aortic dissection or aneurysm. 2. Small fat containing right inguinal hernia. 3. Scattered colonic diverticula without CT evidence for diverticulitis.  Electronically Signed   By: Katherine Mantle M.D.   On: 11/22/2019 20:57   CT HEAD CODE STROKE WO CONTRAST  Result Date: 11/22/2019 CLINICAL DATA:  Code stroke.  Right-sided weakness. EXAM: CT HEAD WITHOUT CONTRAST TECHNIQUE: Contiguous axial images were obtained from the base of the skull through the vertex without intravenous contrast. COMPARISON:  None. FINDINGS: Brain: Focal hypodensities involving the right corona radiata. No intracranial hemorrhage. No mass lesion. No midline shift, ventriculomegaly or extra-axial fluid collection. Vascular: No hyperdense vessel or unexpected calcification.  Skull: Negative for fracture or focal lesion. Sinuses/Orbits: Normal orbits. Mild ethmoid sinus mucosal thickening. No mastoid effusion. Other: None. ASPECTS [facility] Stroke Program Early CT Score) - Ganglionic level infarction (caudate, lentiform nuclei, internal capsule, insula, M1-M3 cortex): 7 - Supraganglionic infarction (M4-M6 cortex): 2 Total score (0-10 with 10 being normal): 9 IMPRESSION: 1. Right corona radiata hypodensities concerning for subacute infarct. 2. ASPECTS is 9 3. Code stroke imaging results were communicated on 11/22/2019 at 8:18 pm to provider Dr. Otelia Limes via secure text paging. Electronically Signed   By: Stana Bunting M.D.   On: 11/22/2019 20:20   CT ANGIO HEAD CODE STROKE  Result Date: 11/22/2019 CLINICAL DATA:  Neuro deficit EXAM: CT ANGIOGRAPHY HEAD AND NECK TECHNIQUE: Multidetector CT imaging of the head and neck was performed using the standard protocol during bolus administration of intravenous contrast. Multiplanar CT image reconstructions and MIPs were obtained to evaluate the vascular anatomy. Carotid stenosis measurements (when applicable) are obtained utilizing NASCET criteria, using the distal internal carotid diameter as the denominator. CONTRAST:  OMNIPAQUE IOHEXOL 350 MG/ML SOLN COMPARISON:  11/22/2019 head CT. FINDINGS: CTA NECK FINDINGS Aortic arch: Standard branching. Imaged portion shows no evidence of aneurysm or dissection. No significant stenosis of the major arch vessel origins. Right carotid system: No evidence of dissection, stenosis (50% or greater) or occlusion. Left carotid system: No evidence of dissection, stenosis (50% or greater) or occlusion. Vertebral arteries: Codominant. No evidence of dissection, stenosis (50% or greater) or occlusion. Skeleton: No acute or suspicious osseous abnormalities. Other neck: No adenopathy.  No soft tissue mass. Upper chest: Dependent atelectasis. Review of the MIP images confirms the above findings CTA HEAD  FINDINGS Anterior circulation: No significant stenosis, proximal occlusion, aneurysm, or vascular malformation. Posterior circulation: No significant stenosis, proximal occlusion, aneurysm, or vascular malformation. Venous sinuses: As permitted by contrast timing, patent. Anatomic variants: None of significance. Review of the MIP images confirms the above findings IMPRESSION: No large vessel occlusion, high-grade narrowing, dissection or aneurysm. These results were called by telephone at the time of interpretation on 11/22/2019 at 8:57 Pm to provider ERIC [facility] , who verbally acknowledged these results. Electronically Signed   By: Stana Bunting M.D.   On: 11/22/2019 21:05   CT ANGIO NECK CODE STROKE  Result Date: 11/22/2019 CLINICAL DATA:  Neuro deficit EXAM: CT ANGIOGRAPHY HEAD AND NECK TECHNIQUE: Multidetector CT imaging of the head and neck was performed using the standard protocol during bolus administration of intravenous contrast. Multiplanar CT image reconstructions and MIPs were obtained to evaluate the vascular anatomy. Carotid stenosis measurements (when applicable) are obtained utilizing NASCET criteria, using the distal internal carotid diameter as the denominator. CONTRAST:  OMNIPAQUE IOHEXOL 350 MG/ML SOLN COMPARISON:  11/22/2019 head CT. FINDINGS: CTA NECK FINDINGS Aortic arch: Standard branching. Imaged portion shows no evidence of aneurysm or dissection. No significant stenosis of the major arch vessel origins. Right carotid system: No evidence of dissection, stenosis (50% or greater) or occlusion.  Left carotid system: No evidence of dissection, stenosis (50% or greater) or occlusion. Vertebral arteries: Codominant. No evidence of dissection, stenosis (50% or greater) or occlusion. Skeleton: No acute or suspicious osseous abnormalities. Other neck: No adenopathy.  No soft tissue mass. Upper chest: Dependent atelectasis. Review of the MIP images confirms the above findings CTA HEAD  FINDINGS Anterior circulation: No significant stenosis, proximal occlusion, aneurysm, or vascular malformation. Posterior circulation: No significant stenosis, proximal occlusion, aneurysm, or vascular malformation. Venous sinuses: As permitted by contrast timing, patent. Anatomic variants: None of significance. Review of the MIP images confirms the above findings IMPRESSION: No large vessel occlusion, high-grade narrowing, dissection or aneurysm. These results were called by telephone at the time of interpretation on 11/22/2019 at 8:57 Pm to provider ERIC [facility] , who verbally acknowledged these results. Electronically Signed   By: Stana Bunting M.D.   On: 11/22/2019 21:05    Procedures Procedures  Medications Ordered in the ED Medications  aspirin tablet 650 mg (650 mg Oral Not Given 11/22/19 2134)  aspirin chewable tablet 81 mg (has no administration in time range)  sodium chloride flush (NS) 0.9 % injection 3 mL (3 mLs Intravenous Given 11/22/19 2020)  LORazepam (ATIVAN) 2 MG/ML injection (  Given 11/22/19 2005)  iohexol (OMNIPAQUE) 350 MG/ML injection 100 mL (100 mLs Intravenous Contrast Given 11/22/19 2016)  diphenhydrAMINE (BENADRYL) injection 25 mg (25 mg Intravenous Given 11/22/19 2151)     MDM Rules/Calculators/A&P MDM  ED Course  I have reviewed the triage vital signs and the nursing notes.  Pertinent labs & imaging results that were available during my care of the patient were reviewed by me and considered in my medical decision making (see chart for details).  Clinical Course as of Nov 21 2298  Wynelle Link Nov 22, 2019  2011 Patient with brief episode of seizure-like activity while in CT. He had rapid return to baseline mental status after given Ativan IV. He now reports history of seizures for which he takes Ativan and Klonopin but he has not had any of that in about a week. He also reports he took a [employer] bus to [place] today to 'be with a girl' that never came to pick him  up. He has been sitting in the bushes all day and ultimately called the police before calling EMS. His initial complaints to EMS were chest pain, requesting NTG, then the neurologic symptoms started. Also now requesting benadryl IV for a 'reaction' to the weeds he was lying in all day.    [CS]  2014 Given his report of chest pain and new neuro symptoms, will add CTA aortic dissection to his Stroke imaging studies.    [CS]  2014 I-stat chem shows moderately elevated K, unclear if this is a hemolyzed sample. Will await formal labs before intervening.    [CS]  2109 Patient's stroke symptoms have improved. He is now moving all extremities, no facial droop but states he still feels weak on the right. He continues to request benadryl for itching all over that he attributes to cats being around the area where he was hanging out all day.     [CS]  2133 Oral benadryl ordered for itching, but patient has failed swallow screen and will need IV meds.    [CS]  2134 CBC is normal.    [CS]  2202 CMP is normal, no signs of hyperkalemia.    [CS]  2247 Spoke with Dr. Otelia Limes, he would like the patient admitted for EEG regardless of  his MRI result. Will page for unassigned admission.    [CS]  2258 Spoke with Dr. Robb Matarrtiz, Hospitalist who will admit.    [CS]    Clinical Course User Index [CS] Pollyann SavoySheldon, Shakelia Scrivner B, MD    Final Clinical Impression(s) / ED Diagnoses Final diagnoses:  Seizure-like activity [facility](HCC)  Right sided weakness    Rx / DC Orders ED Discharge Orders    None       Pollyann SavoySheldon, Ahana Najera B, MD 11/22/19 2300

## 2019-11-22 NOTE — ED Triage Notes (Addendum)
Pt BIB [facility] from [facility] [employer] [place] pt rode bus from [place] today. Pt was waiting for Ride when pt suddenly began having R. Sided Numbness/Weakness at 1900. (LSN: 1900).  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  Finished taking Xarelto a week ago for a PE.   VS with EMS: 196/118 130 HR ST 122 CBG

## 2019-11-23 ENCOUNTER — Emergency Department (HOSPITAL_BASED_OUTPATIENT_CLINIC_OR_DEPARTMENT_OTHER): Payer: Medicaid - Out of State

## 2019-11-23 ENCOUNTER — Observation Stay (HOSPITAL_COMMUNITY): Payer: Medicaid - Out of State

## 2019-11-23 DIAGNOSIS — I10 Essential (primary) hypertension: Secondary | ICD-10-CM | POA: Diagnosis not present

## 2019-11-23 DIAGNOSIS — R531 Weakness: Secondary | ICD-10-CM | POA: Diagnosis not present

## 2019-11-23 DIAGNOSIS — I340 Nonrheumatic mitral (valve) insufficiency: Secondary | ICD-10-CM

## 2019-11-23 DIAGNOSIS — R569 Unspecified convulsions: Secondary | ICD-10-CM | POA: Diagnosis not present

## 2019-11-23 LAB — LIPID PANEL
Cholesterol: 188 mg/dL (ref 0–200)
HDL: 38 mg/dL — ABNORMAL LOW (ref >40)
LDL Cholesterol: 118 mg/dL — ABNORMAL HIGH (ref 0–99)
Total CHOL/HDL Ratio: 4.9 RATIO
Triglycerides: 158 mg/dL — ABNORMAL HIGH (ref <150)
VLDL: 32 mg/dL (ref 0–40)

## 2019-11-23 LAB — ECHOCARDIOGRAM COMPLETE
Area-P 1/2: 3.21 cm2
Height: 68 in
S' Lateral: 2.5 cm
Weight: 3241.64 oz

## 2019-11-23 LAB — RESPIRATORY PANEL BY RT PCR (FLU A&B, COVID)
Influenza A by PCR: NEGATIVE
Influenza B by PCR: NEGATIVE
SARS Coronavirus 2 by RT PCR: NEGATIVE

## 2019-11-23 LAB — HEMOGLOBIN A1C
Hgb A1c MFr Bld: 5.1 % (ref 4.8–5.6)
Mean Plasma Glucose: 99.67 mg/dL

## 2019-11-23 LAB — TROPONIN I (HIGH SENSITIVITY)
Troponin I (High Sensitivity): 5 ng/L (ref <18)
Troponin I (High Sensitivity): 4 ng/L (ref <18)

## 2019-11-23 LAB — HIV ANTIBODY (ROUTINE TESTING W REFLEX): HIV Screen 4th Generation wRfx: NONREACTIVE

## 2019-11-23 MED ORDER — CHLORDIAZEPOXIDE HCL 25 MG PO CAPS
25.0000 mg | ORAL_CAPSULE | Freq: Three times a day (TID) | ORAL | Status: DC
  Filled 2019-11-23: qty 1

## 2019-11-23 MED ORDER — LORAZEPAM 1 MG PO TABS
1.0000 mg | ORAL_TABLET | Freq: Two times a day (BID) | ORAL | Status: DC
  Administered 2019-11-23: 1 mg via ORAL
  Filled 2019-11-23: qty 1

## 2019-11-23 MED ORDER — LORAZEPAM 1 MG PO TABS
1.0000 mg | ORAL_TABLET | Freq: Two times a day (BID) | ORAL | 0 refills | Status: AC
Start: 2019-11-23 — End: 2019-11-28

## 2019-11-23 NOTE — ED Notes (Addendum)
Pt c/o anxiety and dizziness. States his dose of ativan did not help decrease anxiety.  Provided bagged lunch and coke

## 2019-11-23 NOTE — Progress Notes (Signed)
EEG complete - results pending 

## 2019-11-23 NOTE — Progress Notes (Signed)
  Echocardiogram 2D Echocardiogram has been performed.  Celene Skeen 11/23/2019, 1:35 PM

## 2019-11-23 NOTE — Discharge Summary (Signed)
Physician Discharge Summary  Jerry Mills QBV:[id number] DOB: 25-Aug-1975 DOA: 11/22/2019  PCP: Patient, No Pcp Per  Admit date: 11/22/2019 Discharge date: 11/23/2019  Admitted From: Home Disposition: Home/shelter  Recommendations for Outpatient Follow-up:  1. Follow up with PCP in 1-2 weeks 2. Please obtain BMP/CBC in one week 3. Please follow up with your PCP on the following pending results: Unresulted Labs (From admission, onward)         None      -Per [employer], patients with seizures are not allowed to drive until they have been seizure-free for six months. Use caution when using heavy equipment or power tools. Avoid working on ladders or at heights. Take showers instead of baths. Ensure the water temperature is not too high on the home water heater. Do not go swimming alone. Do not lock yourself in a room alone (i.e. bathroom). When caring for infants or small children, sit down when holding, feeding, or changing them to minimize risk of injury to the child in the event you have a seizure. Maintain good sleep hygiene. Avoid alcohol.  Home Health: None Equipment/Devices: None  Discharge Condition: Stable CODE STATUS: Full code Diet recommendation: Regular  Subjective: Seen and examined this morning.  He was very anxious.  He started crying telling me that he has history of panic attacks since he fell from 40 feet high in 2017. he did not have any other specific complaint.  HPI: Jerry Mills is a 44 y.o. male with medical history significant of pulmonary embolus and off Xarelto since March, PTSD, anxiety, class I obesity, hypertension who is coming to the emergency department via EMS after 911 was called due to the patient developing acute right-sided weakness with right facial droop at the local [employer] bus terminal.  He arrived earlier from [place] to visit a male friend, but states that when he arrived to the station she did not pick them up.  He was  waiting around some bushes in the bus station when he developed right-sided weakness.  He apparently had a seizure here and was given Ativan.  He is currently sedated and unable to provide further information.  Seeing neurology and need EP notes for further history and details.  ED Course: Initial vital signs were temperature 98.4 F, pulse 118, respirations 16, BP 141/100 mmHg O2 sat 98% on room air.  The patient received 25 mg right eye pheniramine the emergency department after he complained that he may have has some skin itching due to allergic reactions to the leaves of the bushes he was around bus station.  He also received 2 mg of lorazepam IVP after apparently having a seizure episode in the MRI suite.  CBC and coagulation parameters were normal.  Sodium 133 and CO2 21 mmol/L.  Glucose 102 mg/dL, but this is a nonfasting level.  AST is slightly elevated at 46 units/L.  The rest of the chemistry is within normal range.  Imaging: CT head code stroke was concerning for right corona radiata hypodensity.  CTA head and neck did not find any large vessel occlusion.  MRI brain did not see any acute infarct.  However there was mild to moderate chronic microvascular ischemic disease with a remote lacunar infarct the anterior right basal ganglia/corona radiata.  Please see images and full regular report for further detail.  Brief/Interim Summary: Patient was admitted with acute right-sided weakness.  He was evaluated by neurology.  He did not have any focal deficit.  CT head followed by  MRI brain were all negative for any acute stroke or any other pathology.  He was noted to have some seizure-like activity in the ED.  EEG was done which was also negative for any epileptiform activities.  Neurology did not recommend adding any antiepileptic medications.  Also CT angiogram of the chest, abdomen and pelvis was done and he was ruled out of aortic dissection.  CT angiogram of head and neck was also unremarkable.   In short, all the work-up was unremarkable.  Based on my evaluation, it sounds like patient's symptoms had stemmed from his anxiety and panic disorder.  According to patient, he was in connection with a girl who he met on the Internet and had talked to her couple of times over the phone.  She asked him to come to [place] all the way from [facility], which he did but she never showed up to pick him up.  He was very upset about that.  He initially did not want to go home as he did not have any home or any money.  Our [facility] personnel had a talk to him and provided all the information regarding local shelters and also offered a ride.  He was then agreeable to discharge.  At this point in time, he is being discharged in stable condition other than some anxiety.  According to him, he takes Ativan 2 mg twice daily but he ran out of those about 4 days ago.  This also indicates that he was possibly withdrawing from benzodiazepines.  I have prescribed him 10 tablets of Ativan which should be good for next 5 days.  Discharge Diagnoses:  Principal Problem:   Acute right-sided weakness Active Problems:   Hypertension   Class 1 obesity   Abnormal blood chemistry   Seizure [facility])    Discharge Instructions  Discharge Instructions    Discharge patient   Complete by: As directed    Discharge disposition: 01-Home or Self Care   Discharge patient date: 11/23/2019     Allergies as of 11/23/2019      Reactions   Latex Hives, Rash      Medication List    TAKE these medications   ibuprofen 200 MG tablet Commonly known as: ADVIL Take 200 mg by mouth every 6 (six) hours as needed for fever or moderate pain.   labetalol 100 MG tablet Commonly known as: NORMODYNE Take 50 mg by mouth daily.   LORazepam 1 MG tablet Commonly known as: ATIVAN Take 1 tablet (1 mg total) by mouth 2 (two) times daily for 5 days.       Allergies  Allergen Reactions  . Latex Hives and Rash    Consultations:  Neurology   Procedures/Studies: EEG  Result Date: 11/23/2019 Lora Havens, MD     11/23/2019 10:40 AM Patient Name: Jerry Mills MRN: [id number] Epilepsy Attending: Lora Havens Referring Physician/Provider: Dr. Kerney Elbe Date: 11/23/2019 Duration: 25.23 mins Patient history: 44 year old male with seizure-like episode and acute onset of right hemiparesis.  EEG to evaluate for seizure. Level of alertness: Awake AEDs during EEG study: None Technical aspects: This EEG study was done with scalp electrodes positioned according to the 10-20 International system of electrode placement. Electrical activity was acquired at a sampling rate of 500Hz and reviewed with a high frequency filter of 70Hz and a low frequency filter of 1Hz. EEG data were recorded continuously and digitally stored. Description: The posterior dominant rhythm consists of 10 Hz activity of moderate voltage (25-35 uV)  seen predominantly in posterior head regions, symmetric and reactive to eye opening and eye closing. Physiologic photic driving was not seen during photic stimulation.  Hyperventilation was not performed.   IMPRESSION: This study is within normal limits. No seizures or epileptiform discharges were seen throughout the recording. Lora Havens   MR BRAIN WO CONTRAST  Result Date: 11/22/2019 CLINICAL DATA:  Initial evaluation for neuro deficit, stroke suspected. EXAM: MRI HEAD WITHOUT CONTRAST TECHNIQUE: Multiplanar, multiecho pulse sequences of the brain and surrounding structures were obtained without intravenous contrast. COMPARISON:  Prior CTs from earlier the same day. FINDINGS: Brain: Examination degraded by motion artifact. Cerebral volume within normal limits for age. Scattered patchy T2/FLAIR hyperintensity within the periventricular deep white matter both cerebral hemispheres most consistent with chronic small vessel ischemic disease, mild to moderate in nature. Superimposed remote lacunar infarct present at  the anterior right basal ganglia/corona radiata. No abnormal foci of restricted diffusion to suggest acute or subacute ischemia. Gray-white matter differentiation maintained. No other areas of encephalomalacia to suggest chronic cortical infarction. No foci of susceptibility artifact to suggest acute or chronic intracranial hemorrhage. No mass lesion, midline shift or mass effect. No hydrocephalus or extra-axial fluid collection. Pituitary gland and suprasellar region grossly within normal limits on this motion degraded exam. Midline structures intact. Vascular: Major intracranial vascular flow voids are maintained. Skull and upper cervical spine: Craniocervical junction within normal limits. Bone marrow signal intensity normal. No scalp soft tissue abnormality. Sinuses/Orbits: Globes and orbital soft tissues within normal limits. Scattered mucosal thickening noted throughout the ethmoidal air cells. Tiny right maxillary sinus retention cyst noted. Paranasal sinuses are otherwise largely clear. No mastoid effusion. Other: None. IMPRESSION: 1. No acute intracranial abnormality. 2. 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 Electronically Signed   By: Jeannine Boga M.D.   On: 11/22/2019 23:31   ECHOCARDIOGRAM COMPLETE  Result Date: 11/23/2019    ECHOCARDIOGRAM REPORT   Patient Name:   Jerry Mills Date of Exam: 11/23/2019 Medical Rec #:  [id number]       Height:       68.0 in Accession #:    [id number]      Weight:       202.6 lb Date of Birth:  [birth year]       BSA:          2.055 m Patient Age:    22 years        BP:           146/107 mmHg Patient Gender: M               HR:           97 bpm. Exam Location:  Inpatient Procedure: 2D Echo Indications:    stroke 434.91  History:        Patient has no prior history of Echocardiogram examinations.                 Risk Factors:Hypertension. PE.   Sonographer:    Jannett Celestine RDCS (AE) Referring Phys: [id number] DAVID MANUEL [place]  1. Left ventricular ejection fraction, by estimation, is 65 to 70%. The left ventricle has normal function. The left ventricle has no regional wall motion abnormalities. There is mild concentric left ventricular hypertrophy. Left ventricular diastolic parameters are consistent with Grade I diastolic dysfunction (impaired relaxation).  2. Right ventricular systolic function is normal. The right ventricular size is normal.  3. The mitral valve is normal in structure. Mild mitral valve regurgitation. No evidence of mitral stenosis.  4. The aortic valve is normal in structure. Aortic valve regurgitation is not visualized. No aortic stenosis is present.  5. The inferior vena cava is normal in size with greater than 50% respiratory variability, suggesting right atrial pressure of 3 mmHg. Conclusion(s)/Recommendation(s): No intracardiac source of embolism detected on this transthoracic study. A transesophageal echocardiogram is recommended to exclude cardiac source of embolism if clinically indicated. FINDINGS  Left Ventricle: Left ventricular ejection fraction, by estimation, is 65 to 70%. The left ventricle has normal function. The left ventricle has no regional wall motion abnormalities. The left ventricular internal cavity size was normal in size. There is  mild concentric left ventricular hypertrophy. Left ventricular diastolic parameters are consistent with Grade I diastolic dysfunction (impaired relaxation). Normal left ventricular filling pressure. Right Ventricle: The right ventricular size is normal. No increase in right ventricular wall thickness. Right ventricular systolic function is normal. Left Atrium: Left atrial size was normal in size. Right Atrium: Right atrial size was normal in size. Pericardium: There is no evidence of pericardial effusion. Mitral Valve: The mitral valve is normal in structure. Mild mitral  valve regurgitation. No evidence of mitral valve stenosis. Tricuspid Valve: The tricuspid valve is normal in structure. Tricuspid valve regurgitation is not demonstrated. No evidence of tricuspid stenosis. Aortic Valve: The aortic valve is normal in structure. Aortic valve regurgitation is not visualized. No aortic stenosis is present. Pulmonic Valve: The pulmonic valve was normal in structure. Pulmonic valve regurgitation is not visualized. No evidence of pulmonic stenosis. Aorta: The aortic root is normal in size and structure. Venous: The inferior vena cava is normal in size with greater than 50% respiratory variability, suggesting right atrial pressure of 3 mmHg. IAS/Shunts: No atrial level shunt detected by color flow Doppler.  LEFT VENTRICLE PLAX 2D LVIDd:         4.10 cm  Diastology LVIDs:         2.50 cm  LV e' medial:    9.14 cm/s LV PW:         1.10 cm  LV E/e' medial:  8.2 LV IVS:        1.10 cm  LV e' lateral:   11.60 cm/s LVOT diam:     2.20 cm  LV E/e' lateral: 6.5 LV SV:         75 LV SV Index:   36 LVOT Area:     3.80 cm  LEFT ATRIUM             Index       RIGHT ATRIUM          Index LA diam:        2.80 cm 1.36 cm/m  RA Area:     9.93 cm LA Vol (A2C):   38.8 ml 18.88 ml/m RA Volume:   17.70 ml 8.61 ml/m LA Vol (A4C):   20.7 ml 10.07 ml/m LA Biplane Vol: 28.5 ml 13.87 ml/m  AORTIC VALVE LVOT Vmax:   103.00 cm/s LVOT Vmean:  73.900 cm/s LVOT VTI:    0.197 m  AORTA Ao Root diam: 2.20 cm MITRAL VALVE MV Area (PHT): 3.21 cm    SHUNTS MV Decel Time: 236 msec    Systemic VTI:  0.20 m MV E velocity: 75.40 cm/s  Systemic Diam: 2.20 cm MV A velocity: 83.10 cm/s MV E/A ratio:  0.91 Ena Dawley MD Electronically signed by Houston Siren  Meda Coffee MD Signature Date/Time: 11/23/2019/1:56:46 PM    Final    CT Angio Chest/Abd/Pel for Dissection W and/or W/WO  Result Date: 11/22/2019 CLINICAL DATA:  Stroke follow-up. Chest pain. Concern for dissection. EXAM: CT ANGIOGRAPHY CHEST, ABDOMEN AND PELVIS TECHNIQUE:  Non-contrast CT of the chest was initially obtained. Multidetector CT imaging through the chest, abdomen and pelvis was performed using the standard protocol during bolus administration of intravenous contrast. Multiplanar reconstructed images and MIPs were obtained and reviewed to evaluate the vascular anatomy. CONTRAST:  182m OMNIPAQUE IOHEXOL 350 MG/ML SOLN COMPARISON:  None. FINDINGS: CTA CHEST FINDINGS Cardiovascular: There is no evidence for thoracic aortic dissection or aneurysm. There is no large centrally located pulmonary embolism. The heart size is unremarkable. There is no significant pericardial effusion. Mediastinum/Nodes: -- No mediastinal lymphadenopathy. -- No hilar lymphadenopathy. -- No axillary lymphadenopathy. -- No supraclavicular lymphadenopathy. -- Normal thyroid gland where visualized. -  Unremarkable esophagus. Lungs/Pleura: Airways are patent. No pleural effusion, lobar consolidation, pneumothorax or pulmonary infarction. Musculoskeletal: No chest wall abnormality. No bony spinal canal stenosis. Review of the MIP images confirms the above findings. CTA ABDOMEN AND PELVIS FINDINGS VASCULAR Aorta: Normal caliber aorta without aneurysm, dissection, vasculitis or significant stenosis. Celiac: Patent without evidence of aneurysm, dissection, vasculitis or significant stenosis. SMA: Patent without evidence of aneurysm, dissection, vasculitis or significant stenosis. Renals: Both renal arteries are patent without evidence of aneurysm, dissection, vasculitis, fibromuscular dysplasia or significant stenosis. IMA: Patent without evidence of aneurysm, dissection, vasculitis or significant stenosis. Inflow: Patent without evidence of aneurysm, dissection, vasculitis or significant stenosis. Veins: No obvious venous abnormality within the limitations of this arterial phase study. Review of the MIP images confirms the above findings. NON-VASCULAR Hepatobiliary: The liver is normal. Normal  gallbladder.There is no biliary ductal dilation. Pancreas: Normal contours without ductal dilatation. No peripancreatic fluid collection. Spleen: Unremarkable. Adrenals/Urinary Tract: --Adrenal glands: Unremarkable. --Right kidney/ureter: No hydronephrosis or radiopaque kidney stones. --Left kidney/ureter: No hydronephrosis or radiopaque kidney stones. --Urinary bladder: Unremarkable. Stomach/Bowel: --Stomach/Duodenum: No hiatal hernia or other gastric abnormality. Normal duodenal course and caliber. --Small bowel: Unremarkable. --Colon: There is scattered colonic diverticula without CT evidence for diverticulitis. --Appendix: Normal. Lymphatic: --No retroperitoneal lymphadenopathy. --No mesenteric lymphadenopathy. --No pelvic or inguinal lymphadenopathy. Reproductive: Unremarkable Other: No ascites or free air. There is a small fat containing right inguinal hernia. Musculoskeletal. No acute displaced fractures. Review of the MIP images confirms the above findings. IMPRESSION: 1. No acute thoracic, abdominal or pelvic pathology. Specifically, there is no evidence for aortic dissection or aneurysm. 2. Small fat containing right inguinal hernia. 3. Scattered colonic diverticula without CT evidence for diverticulitis. Electronically Signed   By: CConstance HolsterM.D.   On: 11/22/2019 20:57   CT HEAD CODE STROKE WO CONTRAST  Result Date: 11/22/2019 CLINICAL DATA:  Code stroke.  Right-sided weakness. EXAM: CT HEAD WITHOUT CONTRAST TECHNIQUE: Contiguous axial images were obtained from the base of the skull through the vertex without intravenous contrast. COMPARISON:  None. FINDINGS: Brain: Focal hypodensities involving the right corona radiata. No intracranial hemorrhage. No mass lesion. No midline shift, ventriculomegaly or extra-axial fluid collection. Vascular: No hyperdense vessel or unexpected calcification. Skull: Negative for fracture or focal lesion. Sinuses/Orbits: Normal orbits. Mild ethmoid sinus mucosal  thickening. No mastoid effusion. Other: None. ASPECTS ([facility] Early CT Score) - Ganglionic level infarction (caudate, lentiform nuclei, internal capsule, insula, M1-M3 cortex): 7 - Supraganglionic infarction (M4-M6 cortex): 2 Total score (0-10 with 10 being normal): 9 IMPRESSION: 1. Right corona radiata hypodensities  concerning for subacute infarct. 2. ASPECTS is 9 3. Code stroke imaging results were communicated on 11/22/2019 at 8:18 pm to provider Dr. Cheral Marker via secure text paging. Electronically Signed   By: Primitivo Gauze M.D.   On: 11/22/2019 20:20   CT ANGIO HEAD CODE STROKE  Result Date: 11/22/2019 CLINICAL DATA:  Neuro deficit EXAM: CT ANGIOGRAPHY HEAD AND NECK TECHNIQUE: Multidetector CT imaging of the head and neck was performed using the standard protocol during bolus administration of intravenous contrast. Multiplanar CT image reconstructions and MIPs were obtained to evaluate the vascular anatomy. Carotid stenosis measurements (when applicable) are obtained utilizing NASCET criteria, using the distal internal carotid diameter as the denominator. CONTRAST:  194m OMNIPAQUE IOHEXOL 350 MG/ML SOLN COMPARISON:  11/22/2019 head CT. FINDINGS: CTA NECK FINDINGS Aortic arch: Standard branching. Imaged portion shows no evidence of aneurysm or dissection. No significant stenosis of the major arch vessel origins. Right carotid system: No evidence of dissection, stenosis (50% or greater) or occlusion. Left carotid system: No evidence of dissection, stenosis (50% or greater) or occlusion. Vertebral arteries: Codominant. No evidence of dissection, stenosis (50% or greater) or occlusion. Skeleton: No acute or suspicious osseous abnormalities. Other neck: No adenopathy.  No soft tissue mass. Upper chest: Dependent atelectasis. Review of the MIP images confirms the above findings CTA HEAD FINDINGS Anterior circulation: No significant stenosis, proximal occlusion, aneurysm, or vascular  malformation. Posterior circulation: No significant stenosis, proximal occlusion, aneurysm, or vascular malformation. Venous sinuses: As permitted by contrast timing, patent. Anatomic variants: None of significance. Review of the MIP images confirms the above findings IMPRESSION: No large vessel occlusion, high-grade narrowing, dissection or aneurysm. These results were called by telephone at the time of interpretation on 11/22/2019 at 8:57 Pm to [facility], who verbally acknowledged these results. Electronically Signed   By: CPrimitivo GauzeM.D.   On: 11/22/2019 21:05   CT ANGIO NECK CODE STROKE  Result Date: 11/22/2019 CLINICAL DATA:  Neuro deficit EXAM: CT ANGIOGRAPHY HEAD AND NECK TECHNIQUE: Multidetector CT imaging of the head and neck was performed using the standard protocol during bolus administration of intravenous contrast. Multiplanar CT image reconstructions and MIPs were obtained to evaluate the vascular anatomy. Carotid stenosis measurements (when applicable) are obtained utilizing NASCET criteria, using the distal internal carotid diameter as the denominator. CONTRAST:  1066mOMNIPAQUE IOHEXOL 350 MG/ML SOLN COMPARISON:  11/22/2019 head CT. FINDINGS: CTA NECK FINDINGS Aortic arch: Standard branching. Imaged portion shows no evidence of aneurysm or dissection. No significant stenosis of the major arch vessel origins. Right carotid system: No evidence of dissection, stenosis (50% or greater) or occlusion. Left carotid system: No evidence of dissection, stenosis (50% or greater) or occlusion. Vertebral arteries: Codominant. No evidence of dissection, stenosis (50% or greater) or occlusion. Skeleton: No acute or suspicious osseous abnormalities. Other neck: No adenopathy.  No soft tissue mass. Upper chest: Dependent atelectasis. Review of the MIP images confirms the above findings CTA HEAD FINDINGS Anterior circulation: No significant stenosis, proximal occlusion, aneurysm, or vascular  malformation. Posterior circulation: No significant stenosis, proximal occlusion, aneurysm, or vascular malformation. Venous sinuses: As permitted by contrast timing, patent. Anatomic variants: None of significance. Review of the MIP images confirms the above findings IMPRESSION: No large vessel occlusion, high-grade narrowing, dissection or aneurysm. These results were called by telephone at the time of interpretation on 11/22/2019 at 8:57 Pm to provider [facility] who verbally acknowledged these results. Electronically Signed   By: ChPrimitivo Gauze.D.   On:  11/22/2019 21:05      Discharge Exam: Vitals:   11/23/19 1200 11/23/19 1555  BP:  (!) 146/103  Pulse: (!) 110 (!) 114  Resp: (!) 25   Temp:    SpO2: 100%    Vitals:   11/23/19 1100 11/23/19 1145 11/23/19 1200 11/23/19 1555  BP: (!) 147/126 (!) 143/119  (!) 146/103  Pulse: 100  (!) 110 (!) 114  Resp:   (!) 25   Temp:      TempSrc:      SpO2: 100%  100%   Weight:      Height:        General: Pt is alert, awake, not in acute distress Cardiovascular: RRR, S1/S2 +, no rubs, no gallops Respiratory: CTA bilaterally, no wheezing, no rhonchi Abdominal: Soft, NT, ND, bowel sounds + Extremities: no edema, no cyanosis    The results of significant diagnostics from this hospitalization (including imaging, microbiology, ancillary and laboratory) are listed below for reference.     Microbiology: Recent Results (from the past 240 hour(s))  Respiratory Panel by RT PCR (Flu A&B, Covid) - Nasopharyngeal Swab     Status: None   Collection Time: 11/22/19 11:27 PM   Specimen: Nasopharyngeal Swab  Result Value Ref Range Status   SARS Coronavirus 2 by RT PCR NEGATIVE NEGATIVE Final    Comment: (NOTE) SARS-CoV-2 target nucleic acids are NOT DETECTED.  The SARS-CoV-2 RNA is generally detectable in upper respiratoy specimens during the acute phase of infection. The lowest concentration of SARS-CoV-2 viral copies this assay can  detect is 131 copies/mL. A negative result does not preclude SARS-Cov-2 infection and should not be used as the sole basis for treatment or other patient management decisions. A negative result may occur with  improper specimen collection/handling, submission of specimen other than nasopharyngeal swab, presence of viral mutation(s) within the areas targeted by this assay, and inadequate number of viral copies (<131 copies/mL). A negative result must be combined with clinical observations, patient history, and epidemiological information. The expected result is Negative.  Fact Sheet for Patients:  PinkCheek.be  Fact Sheet for Healthcare Providers:  GravelBags.it  This test is no t yet approved or cleared by the [place] FDA and  has been authorized for detection and/or diagnosis of SARS-CoV-2 by FDA under an Emergency Use Authorization (EUA). This EUA will remain  in effect (meaning this test can be used) for the duration of the COVID-19 declaration under Section 564(b)(1) of the Act, 21 U.S.C. section 360bbb-3(b)(1), unless the authorization is terminated or revoked sooner.     Influenza A by PCR NEGATIVE NEGATIVE Final   Influenza B by PCR NEGATIVE NEGATIVE Final    Comment: (NOTE) The Xpert Xpress SARS-CoV-2/FLU/RSV assay is intended as an aid in  the diagnosis of influenza from Nasopharyngeal swab specimens and  should not be used as a sole basis for treatment. Nasal washings and  aspirates are unacceptable for Xpert Xpress SARS-CoV-2/FLU/RSV  testing.  Fact Sheet for Patients: PinkCheek.be  Fact Sheet for Healthcare Providers: GravelBags.it  This test is not yet approved or cleared by the [place] FDA and  has been authorized for detection and/or diagnosis of SARS-CoV-2 by  FDA under an Emergency Use Authorization (EUA). This EUA will remain  in  effect (meaning this test can be used) for the duration of the  Covid-19 declaration under Section 564(b)(1) of the Act, 21  U.S.C. section 360bbb-3(b)(1), unless the authorization is  terminated or revoked. Performed at [facility], [address] [place], [place] [address]      Labs: BNP (last 3 results) No results for input(s): BNP in the last 8760 hours. Basic Metabolic Panel: Recent Labs  Lab 11/22/19 2010 11/22/19 2117  NA 132* 133*  K 6.6* 3.9  CL 103 100  CO2  --  21*  GLUCOSE 128* 102*  BUN 13 6  CREATININE 1.30* 1.05  CALCIUM  --  9.3   Liver Function Tests: Recent Labs  Lab 11/22/19 2117  AST 46*  ALT 33  ALKPHOS 53  BILITOT 0.6  PROT 6.7  ALBUMIN 3.8   No results for input(s): LIPASE, AMYLASE in the last 168 hours. No results for input(s): AMMONIA in the last 168 hours. CBC: Recent Labs  Lab 11/22/19 2010 11/22/19 2100  WBC  --  7.4  NEUTROABS  --  4.8  HGB 16.7 16.3  HCT 49.0 46.8  MCV  --  93.0  PLT  --  239   Cardiac Enzymes: No results for input(s): CKTOTAL, CKMB, CKMBINDEX, TROPONINI in the last 168 hours. BNP: Invalid input(s): POCBNP CBG: Recent Labs  Lab 11/22/19 2000  GLUCAP 134*   D-Dimer No results for input(s): DDIMER in the last 72 hours. Hgb A1c Recent Labs    11/23/19 0856  HGBA1C 5.1   Lipid Profile Recent Labs    11/23/19 0856  CHOL 188  HDL 38*  LDLCALC 118*  TRIG 158*  CHOLHDL 4.9   Thyroid function studies No results for input(s): TSH, T4TOTAL, T3FREE, THYROIDAB in the last 72 hours.  Invalid input(s): FREET3 Anemia work up No results for input(s): VITAMINB12, FOLATE, FERRITIN, TIBC, IRON, RETICCTPCT in the last 72 hours. Urinalysis No results found for: COLORURINE, APPEARANCEUR, [place], [place], [place], [place], [place], [place], PROTEINUR, UROBILINOGEN, NITRITE, LEUKOCYTESUR Sepsis Labs Invalid input(s): PROCALCITONIN,  WBC,  LACTICIDVEN Microbiology Recent Results (from the past  240 hour(s))  Respiratory Panel by RT PCR (Flu A&B, Covid) - Nasopharyngeal Swab     Status: None   Collection Time: 11/22/19 11:27 PM   Specimen: Nasopharyngeal Swab  Result Value Ref Range Status   SARS Coronavirus 2 by RT PCR NEGATIVE NEGATIVE Final    Comment: (NOTE) SARS-CoV-2 target nucleic acids are NOT DETECTED.  The SARS-CoV-2 RNA is generally detectable in upper respiratoy specimens during the acute phase of infection. The lowest concentration of SARS-CoV-2 viral copies this assay can detect is 131 copies/mL. A negative result does not preclude SARS-Cov-2 infection and should not be used as the sole basis for treatment or other patient management decisions. A negative result may occur with  improper specimen collection/handling, submission of specimen other than nasopharyngeal swab, presence of viral mutation(s) within the areas targeted by this assay, and inadequate number of viral copies (<131 copies/mL). A negative result must be combined with clinical observations, patient history, and epidemiological information. The expected result is Negative.  Fact Sheet for Patients:  PinkCheek.be  Fact Sheet for Healthcare Providers:  GravelBags.it  This test is no t yet approved or cleared by the [place] FDA and  has been authorized for detection and/or diagnosis of SARS-CoV-2 by FDA under an Emergency Use Authorization (EUA). This EUA will remain  in effect (meaning this test can be used) for the duration of the COVID-19 declaration under Section 564(b)(1) of the Act, 21 U.S.C. section 360bbb-3(b)(1), unless the authorization is terminated or revoked sooner.     Influenza A by PCR NEGATIVE NEGATIVE Final   Influenza B by PCR NEGATIVE NEGATIVE  Final    Comment: (NOTE) The Xpert Xpress SARS-CoV-2/FLU/RSV assay is intended as an aid in  the diagnosis of influenza from Nasopharyngeal swab specimens and  should  not be used as a sole basis for treatment. Nasal washings and  aspirates are unacceptable for Xpert Xpress SARS-CoV-2/FLU/RSV  testing.  Fact Sheet for Patients: PinkCheek.be  Fact Sheet for Healthcare Providers: GravelBags.it  This test is not yet approved or cleared by the [place] FDA and  has been authorized for detection and/or diagnosis of SARS-CoV-2 by  FDA under an Emergency Use Authorization (EUA). This EUA will remain  in effect (meaning this test can be used) for the duration of the  Covid-19 declaration under Section 564(b)(1) of the Act, 21  U.S.C. section 360bbb-3(b)(1), unless the authorization is  terminated or revoked. Performed at [facility], [place] [address] [place], [place] [address]      Time coordinating discharge: Over 30 minutes  SIGNED:   Darliss Cheney, MD  [facility] 11/23/2019, 4:03 PM  If 7PM-7AM, please contact night-coverage [email or web address]

## 2019-11-23 NOTE — ED Notes (Signed)
x2 failed attempts to draw blood from pt

## 2019-11-23 NOTE — Procedures (Signed)
Patient Name: Jerry Mills  MRN: [id number]  Epilepsy Attending: Charlsie Quest  Referring Physician/Provider: Dr. Caryl Pina Date: 11/23/2019 Duration: 25.23 mins  Patient history: 44 year old male with seizure-like episode and acute onset of right hemiparesis.  EEG to evaluate for seizure.  Level of alertness: Awake  AEDs during EEG study: None  Technical aspects: This EEG study was done with scalp electrodes positioned according to the 10-20 International system of electrode placement. Electrical activity was acquired at a sampling rate of 500Hz  and reviewed with a high frequency filter of 70Hz  and a low frequency filter of 1Hz . EEG data were recorded continuously and digitally stored.   Description: The posterior dominant rhythm consists of 10 Hz activity of moderate voltage (25-35 uV) seen predominantly in posterior head regions, symmetric and reactive to eye opening and eye closing. Physiologic photic driving was not seen during photic stimulation.  Hyperventilation was not performed.     IMPRESSION: This study is within normal limits. No seizures or epileptiform discharges were seen throughout the recording.   Rachel Samples 

## 2019-11-23 NOTE — Code Documentation (Addendum)
Stroke Response Nurse Documentation Code Documentation  Jerry Mills is a 44 y.o. male arriving to [facility] ED via [facility] on 9/26 with past medical hx of PE (Xarelto) and seizures. Code stroke was activated by EMS. Patient [place] bus stop [place] he was LKW at 1910 and now complaining of Right facial droop and right weakness . On aspirin 81 mg daily. Stroke team at the bedside on patient arrival. Labs drawn and patient cleared for CT by Dr. Bernette Mayers. Patient to CT with team. NIHSS 7, see documentation for details and code stroke times. Patient with right hemianopia, right facial droop, right arm weakness, right leg weakness and right decreased sensation on exam initially. The following imaging was completed:  CTA head and neck. While in CT, patient had a brief seizure-like episode and received Ativan. Then his symptoms rapidly improved and was functional. Patient is not a candidate for tPA due to symptoms improving.Bedside handoff with ED RN Sanda Klein.    Rose Fillers  Rapid Response RN

## 2019-11-23 NOTE — Progress Notes (Signed)
Occupational Therapy Evaluation Patient Details Name: Jerry Mills MRN: [id number] DOB: [birth year] Today's Date: 11/23/2019    History of Present Illness 44 y.o. male presenting with acute onset of right hemiparesis, right facial droop, right hemisensory loss. Also with seizure versus pseudoseizure at CT. MRI (-) for acute CVA.    Clinical Impression   Pt states he is here from [place] to meet a girl he had been talking with online for a month. This girl did not show up at the bus station. States he gave up his room that he was renting in [place]; also states he "recently buried his daughter in May". Pt with inconsistent performance throughout session - unable to move hand, yet able to use R hand to donn shirt and pull pulse ox sensor off L hand. Attempted ambulation during cotreat however pt required +2 Max A to mobilize pt back to stretcher due to "near fall" due to BLE "buckling" and staggering. Once on end of stretcher, pt independently then demonstrated sudden lean toward R and required Max A to prevent pt from falling off stretcher. Given pt's functional deficits, CIR would be an appropriate venue for rehab however MRI (-) for CVA. Pt with apparent life stressors. Recommend Psych consult. Will follow acutely.     Follow Up Recommendations  Supervision/Assistance - 24 hour ; TBA   Equipment Recommendations  3 in 1 bedside commode    Recommendations for Other Services Other (comment) (psych consult)     Precautions / Restrictions Precautions Precautions: Fall      Mobility Bed Mobility Overal bed mobility: Modified Independent             General bed mobility comments: Moving around on stretcher independently at times  Transfers Overall transfer level: Needs assistance Equipment used: 2 person hand held assist;1 person hand held assist Transfers: Sit to/from [employer] Sit to Stand: +2 physical assistance;Max assist;Min assist Stand pivot transfers:  Max assist;+2 physical assistance       General transfer comment: Pt initially stood with min A to steady pt then pt began buckling B knees and required Max A + 2 to return to bed; BLE buckling; pt shaking    Balance Overall balance assessment: Needs assistance   Sitting balance-Leahy Scale: Zero Sitting balance - Comments: Pt sitting on end of stretcher without apparent balance deficits then suddently fell to right, requiring Max A to prevent fall     Standing balance-Leahy Scale: Zero                             ADL either performed or assessed with clinical judgement   ADL Overall ADL's : Needs assistance/impaired Eating/Feeding: Set up   Grooming: Set up   Upper Body Bathing: Set up;Sitting   Lower Body Bathing: Minimal assistance;Sit to/from stand   Upper Body Dressing : Set up;Sitting   Lower Body Dressing: Minimal assistance Lower Body Dressing Details (indicate cue type and reason): Pt states he put his socks and shoes on by himself while in the bed Toilet Transfer: +2 for physical assistance;Maximal assistance Toilet Transfer Details (indicate cue type and reason): simulated; Pt buckling on R and L LE requiring Max A to prevent fall   Toileting - Clothing Manipulation Details (indicate cue type and reason): using urinal independently     Functional mobility during ADLs: Maximal assistance;+2 for physical assistance       Vision Baseline Vision/History:  (during functional tasks) Additional Comments:  will further assess; complains of light sensitivity at bas[place]     Perception Perception Comments: appears intact   Praxis Praxis Praxis tested?: Within functional limits    Pertinent Vitals/Pain Pain Assessment: 0-10 Pain Score: 0-No pain Pain Location: L arm, jaw; head Pain Descriptors / Indicators: Tightness;Discomfort Pain Intervention(s): Monitored during session     Hand Dominance  (uses both)   Extremity/Trunk Assessment Upper  Extremity Assessment Upper Extremity Assessment: RUE deficits/detail RUE Deficits / Details: Pt initially not moving RUE, then moved RUE to donn shirt adn pull off pulse ox sticker from L hand; inconsistent response and subbmaximal effort at tiems; unable to complete opposition of finger to thumb, however using funcitonally at times; Able to hold briefly against gravity but lets arm come down in somewhat controlled pattern to hit hat bill.  RUE Sensation: decreased light touch (per pt) RUE Coordination: decreased fine motor;decreased gross motor   Lower Extremity Assessment Lower Extremity Assessment: RLE deficits/detail;LLE deficits/detail   Cervical / Trunk Assessment Cervical / Trunk Assessment: Other exceptions Cervical / Trunk Exceptions: R bias at times, howevr able to maintain midlein postural control and weight hsift without difficulty when mobilizing in bed   Communication Communication Communication: No difficulties   Cognition Arousal/Alertness: Awake/alert Behavior During Therapy: Restless;Agitated Overall Cognitive Status: No family/caregiver present to determine baseline cognitive functioning                                 General Comments: easily agitated; apparent life stressors   General Comments  Pt discussed life stressors of not haivng housing or money at this time    Exercises     Shoulder Instructions      Home Living Family/patient expects to be discharged to:: Private residence                                 Additional Comments: was living in [place] renting a room from someone      Prior Functioning/Environment Level of Independence: Independent        Comments: on disability; "buring daughter in May"        OT Problem List: Decreased strength;Decreased range of motion;Decreased activity tolerance;Impaired balance (sitting and/or standing);Decreased coordination;Decreased safety awareness;Decreased knowledge of use  of DME or AE;Impaired sensation;Impaired UE functional use      OT Treatment/Interventions: Self-care/ADL training;Therapeutic exercise;Neuromuscular education;DME and/or AE instruction;Therapeutic activities;Balance training;Patient/family education    OT Goals(Current goals can be found in the care plan section) Acute Rehab OT Goals Patient Stated Goal: to have someone help him find somewhere to live OT Goal Formulation: With patient Time For Goal Achievement: 12/07/19 Potential to Achieve Goals: Good  OT Frequency: Min 2X/week   Barriers to D/C: Other (comment) (homeless)          Co-evaluation PT/OT/SLP Co-Evaluation/Treatment: Yes Reason for Co-Treatment: For patient/therapist safety PT goals addressed during session: Mobility/safety with mobility OT goals addressed during session: ADL's and self-care      AM-PAC OT "6 Clicks" Daily Activity     Outcome Measure Help from another person eating meals?: None Help from another person taking care of personal grooming?: A Little Help from another person toileting, which includes using toliet, bedpan, or urinal?: A Little Help from another person bathing (including washing, rinsing, drying)?: A Little Help from another person to put on and taking off regular upper body clothing?: A  Little Help from another person to put on and taking off regular lower body clothing?: A Little 6 Click Score: 19   End of Session Equipment Utilized During Treatment: Gait belt Nurse Communication: Mobility status  Activity Tolerance: Patient tolerated treatment well Patient left: in bed;with call bell/phone within reach  OT Visit Diagnosis: Unsteadiness on feet (R26.81);Other abnormalities of gait and mobility (R26.89);Muscle weakness (generalized) (M62.81);Ataxia, unspecified (R27.0);Dizziness and giddiness (R42);Pain Pain - Right/Left: Left Pain - part of body: Arm;Hand (Head; trunk)                Time: 9924-2683 OT Time Calculation (min):  24 min Charges:  OT General Charges $OT Visit: 1 Visit OT Evaluation $OT Eval Moderate Complexity: 1 Mod  Sanav Remer, OT/L   Acute OT Clinical Specialist Acute Rehabilitation Services Pager [phone] Office [phone]   [facility] 11/23/2019, 12:45 PM

## 2019-11-23 NOTE — Discharge Instructions (Signed)
Non-Epileptic Seizures, Adult A seizure can cause:  Involuntary movements, like falling or shaking.  Changes in awareness or consciousness.  Convulsions. These are episodes of uncontrollable, jerking movement caused by sudden, intense tightening (contraction) of the muscles. Epileptic seizures are caused by abnormal electrical activity in the brain. Non-epileptic seizures are different. They are not caused by abnormal electrical signals in your brain. These seizures may look like epileptic seizures, but they are not caused by epilepsy. There are two types of non-epileptic seizures:  Physiologic non-epileptic seizure. This type results from an underlying problem that causes a disruption in your brain's electrical activity.  Psychogenic non-epileptic seizure. This type results from emotional stress. These seizures are sometimes called pseudoseizures. What are the causes? Causes of physiologic non-epileptic seizures can include:  Sudden drop in blood pressure.  Low blood sugar (glucose).  Low levels of salt (sodium) in your blood.  Low levels of calcium in your blood.  Migraine.  Heart rhythm problems.  Sleep disorders, such as narcolepsy.  Movement disorders, such as Tourette syndrome.  Infection.  Certain medicines.  Drug and alcohol abuse.  Fever. Common causes of psychogenic non-epileptic seizures can include:  Stress.  Emotional trauma.  Sexual or physical abuse.  Major life events, such as divorce or death of a loved one.  Mental health disorders, including anxiety and depression. What are the signs or symptoms? Symptoms of a non-epileptic seizure can be similar to those of an epileptic seizure, which may include:  A change in attention or behavior (altered mental status).  Loss of consciousness or fainting.  Convulsions with rhythmic jerking movements.  Drooling.  Rapid eye movements.  Grunting.  Loss of bladder control and bowel  control.  Bitter taste in the mouth.  Tongue biting. Some people experience unusual sensations (aura) before having a seizure. These can include:  "Butterflies" in the stomach.  Abnormal smells or tastes.  A feeling of having had a new experience before (dj vu). After a non-epileptic seizure, you may have a headache or sore muscles or feel confused and sleepy. Non-epileptic seizures usually:  Do not cause physical injuries.  Start slowly.  Include crying or shrieking.  Last longer than 2 minutes.  Include pelvic thrusting. How is this diagnosed? Non-epileptic seizures may be diagnosed by:  Your medical history.  A physical exam.  Your symptoms. ? Your health care provider may want to talk with your friends or relatives who have seen you have a seizure. ? If possible, it is helpful if you write down your seizure activity, including what led up to the seizure, and share that information with your health care provider. You may also need to have tests to look for causes of physiologic non-epileptic seizures. These may include:  An electroencephalogram (EEG). This test measures electrical activity in your brain. If you have had a non-epileptic seizure, the results of your EEG will likely be normal.  Video EEG. This test takes place in the hospital over the course of 2-7 days. The test uses a video camera and an EEG to monitor your symptoms and the electrical activity in your brain.  Blood tests.  Lumbar puncture. This test involves pulling fluid from your spine to check for infection.  Electrocardiogram (ECG or EKG). This test checks for an abnormal heart rhythm.  CT scan. If your health care provider thinks you have had a psychogenic non-epileptic seizure, you may need to see a mental health specialist for an evaluation. How is this treated? The treatment for  your seizures will depend on what is causing them. When the underlying condition is treated, your seizures should  stop. If your seizures are being caused by emotional trauma or stress, your health care provider may recommend that you see a mental health professional. Treatment may include:  Relaxation therapy or cognitive behavioral therapy.  Medicines to treat depression or anxiety.  Individual or family counseling. In some cases, you may have psychogenic seizures in addition to epileptic seizures. If this is the case, you may be prescribed medicine to help with the epileptic seizures. Follow these instructions at home: Home care will depend on the type of non-epileptic seizures that you have. In general:  Follow all instructions from your health care provider. These may include ways to prevent seizures and what to do if you have a seizure.  Take over-the-counter and prescription medicines only as told by your health care provider.  Keep all follow-up visits as told by your health care provider. This is important.  Make sure family members, friends, and coworkers are trained on how to help you if you have a seizure. If you have a seizure, they should: ? Lay you on the ground to prevent a fall. ? Place a pillow or piece of clothing under your head. ? Loosen any clothing around your neck. ? Turn you onto your side. If vomiting occurs, this helps keep your airway clear.  Avoid any substances that may prevent your medicine from working properly. If you are prescribed medicine for seizures: ? Do not use recreational drugs. ? Limit or avoid alcoholic beverages. Contact a health care provider if:  Your seizures change or become more frequent.  You continue to have seizures after treatment. Get help right away if:  You injure yourself during a seizure.  You have one seizure after another.  You have trouble recovering from a seizure.  You have chest pain or trouble breathing.  You have a seizure that lasts longer than 5 minutes. Summary  Non-epileptic seizures may look like epileptic  seizures, but they are not caused by epilepsy.  The treatment for your seizures will depend on what is causing them. When the underlying condition is treated, your seizures should stop.  Make sure family members, friends, and coworkers are trained on how to help you if you have a seizure. If you have a seizure, they should lay you on the ground to prevent a fall, protect your head and neck, and turn you onto your side. This information is not intended to replace advice given to you by your health care provider. Make sure you discuss any questions you have with your health care provider. Document Revised: 01/25/2017 Document Reviewed: 11/25/2015 Elsevier Patient Education  2020 [employer]. [employer] [address] [place], [place] [address]

## 2019-11-23 NOTE — Evaluation (Cosign Needed)
Clinical/Bedside Swallow Evaluation Patient Details  Name: Jerry Mills MRN: [id number] Date of Birth: September 01, 1975  Today's Date: 11/23/2019 Time: SLP Start Time (ACUTE ONLY): 0850 SLP Stop Time (ACUTE ONLY): 0910 SLP Time Calculation (min) (ACUTE ONLY): 20 min  Past Medical History:  Past Medical History:  Diagnosis Date   Anxiety    Class 1 obesity 11/22/2019   Hypertension 11/22/2019   PTSD (post-traumatic stress disorder)    Pulmonary embolism (HCC)    Past Surgical History: History reviewed. No pertinent surgical history. HPI:  Pt is a 44 y.o. male with medical history significant of pulmonary embolus and off Xarelto since March, PTSD, anxiety, class I obesity, hypertension who is coming to the emergency department via EMS after 911 was called due to the patient developing acute right-sided weakness with right facial droo. Also presents with seizure versus pseudoseizure at CT. 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   Assessment / Plan / Recommendation Clinical Impression  Pt was seen for BSE and was cooperative but agitated, reporting several complaints of pain and discomfort. Suspect that pt may be exacerbating symptoms due to anxiety and circumstances of admission to ED. Given initial PO of thin liquid, pt demonstrated an immediate cough and compaints of globus sensation. Subsequent POs of thin liquid did not reveal any overt s/sx of aspiration. Puree and solids also tolerated without overt s/sx of aspiration. Recommend regular diet and thin liquid. No SLP f/u recommended.  SLP Visit Diagnosis: Dysphagia, unspecified (R13.10)    Aspiration Risk  Mild aspiration risk    Diet Recommendation Regular;Thin liquid   Liquid Administration via: Cup;Straw Medication Administration: Whole meds with liquid Compensations: Slow rate;Small sips/bites Postural Changes: Seated upright at 90 degrees    Other   Recommendations Oral Care Recommendations: Oral care BID   Follow up Recommendations        Frequency and Duration            Prognosis        Swallow Study   General Date of Onset: 11/22/19 HPI: Pt is a 44 y.o. male with medical history significant of pulmonary embolus and off Xarelto since March, PTSD, anxiety, class I obesity, hypertension who is coming to the emergency department via EMS after 911 was called due to the patient developing acute right-sided weakness with right facial droo. Also presents with seizure versus pseudoseizure at CT. 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 Type of Study: Bedside Swallow Evaluation Diet Prior to this Study: NPO Respiratory Status: Nasal cannula History of Recent Intubation: No Behavior/Cognition: Agitated;Distractible;Alert;Cooperative Oral Cavity Assessment: Within Functional Limits Oral Care Completed by SLP: No Oral Cavity - Dentition: Adequate natural dentition Vision: Functional for self-feeding Self-Feeding Abilities: Able to feed self Patient Positioning: Upright in bed Baseline Vocal Quality: Normal    Oral/Motor/Sensory Function Overall Oral Motor/Sensory Function: Within functional limits   Ice Chips Ice chips: Not tested   Thin Liquid Thin Liquid: Impaired Presentation: Self Fed;Cup;Straw Pharyngeal  Phase Impairments: Cough - Immediate    Nectar Thick Nectar Thick Liquid: Not tested   Honey Thick Honey Thick Liquid: Not tested   Puree Puree: Within functional limits   Solid     Solid: Within functional limits      Royetta Crochet 11/23/2019,10:09 AM

## 2019-11-23 NOTE — ED Notes (Signed)
Pt states concerns about housing after discharge and ability to return home to [place]. Requesting a SW or Case Manager to help with resources

## 2019-11-23 NOTE — ED Notes (Signed)
Pt complaining of cramping in right arm and leg during neuro checks when asked to lift them off bed. States it feels like they are contracted and cannot fully straighten.  Pt c/o feeling very anxious, states he takes Ativan PRN at home usually twice a day for anxiety and PTSD symptoms

## 2019-11-23 NOTE — Progress Notes (Addendum)
Physical Therapy Evaluation Patient Details Name: Jerry Mills MRN: [id number] DOB: Dec 05, 1975 Today's Date: 11/23/2019   History of Present Illness  44 y.o. male presenting with acute onset of right hemiparesis, right facial droop, right hemisensory loss. Also with seizure versus pseudoseizure at CT. MRI (-) for acute CVA.   Clinical Impression  Pt admitted with/for acute onset of neurological symptoms on the right described above.  Pt showing variability in symptoms, strength and function, but needing up to max assist for OOB mobility at this time..  Pt currently limited functionally due to the problems listed. ( See problems list.)   Pt will benefit from PT to maximize function and safety in order to get ready for next venue listed below.     Follow Up Recommendations Other (comment);Supervision/Assistance - 24 hour (Psych consult to help deal with all his stressors)  Rehab follow up to be assessed as therapy progresses    Equipment Recommendations  None recommended by PT    Recommendations for Other Services       Precautions / Restrictions Precautions Precautions: Fall      Mobility  Bed Mobility Overal bed mobility: Modified Independent             General bed mobility comments: Moving around on stretcher independently at times  Transfers Overall transfer level: Needs assistance Equipment used: 2 person hand held assist;1 person hand held assist Transfers: Sit to/from [employer] Sit to Stand: +2 physical assistance;Max assist;Min assist Stand pivot transfers: Max assist;+2 physical assistance       General transfer comment: Pt initially stood with min A fto steady pt then pt began buckling B knees and required Max A + 2 toreturn to bed  Ambulation/Gait Ambulation/Gait assistance: Max assist;+2 physical assistance Gait Distance (Feet): 4 Feet (forward and back x2) Assistive device: 2 person hand held assist;1 person hand held assist Gait  Pattern/deviations: Step-to pattern   Gait velocity interpretation: <1.31 ft/sec, indicative of household ambulator General Gait Details: buckling knees R>L LE.  uncoordinated and variable presentation from static to dynamic.  Stairs            Wheelchair Mobility    Modified Rankin (Stroke Patients Only)       Balance Overall balance assessment: Needs assistance   Sitting balance-Leahy Scale: Zero Sitting balance - Comments: Pt sitting on end of stretcher without apparent balance deficits then suddently fell to right, requiring Max A to prevent fall     Standing balance-Leahy Scale: Zero                               Pertinent Vitals/Pain Pain Assessment: 0-10 Pain Score: 0-No pain Pain Location: L arm, jaw; head Pain Descriptors / Indicators: Tightness;Discomfort Pain Intervention(s): Monitored during session    Home Living Family/patient expects to be discharged to:: Private residence                 Additional Comments: was living in [place] renting a room from someone    Prior Function Level of Independence: Independent         Comments: on disability; "buring daughter in May"     Hand Dominance   Dominant Hand:  (uses both)    Extremity/Trunk Assessment   Upper Extremity Assessment Upper Extremity Assessment: RUE deficits/detail RUE Deficits / Details: Pt initially not moving RUE, then moved RUE to donn shirt adn pull off pulse ox sticker from L hand; inconsistent  response and subbmaximal effort at tiems; unable to complete opposition of finger to thumb, however using funcitonally at times; Able to hold briefly against gravity but lets arm come down in somewhat controlled pattern to hit hat bill.  RUE Sensation: decreased light touch (per pt) RUE Coordination: decreased fine motor;decreased gross motor    Lower Extremity Assessment Lower Extremity Assessment: RLE deficits/detail;LLE deficits/detail RLE Deficits / Details:  variability in functional strength which does not match MMT.  pt co-contracting flexors/extensors, accepting weight on the R LE, then not with the next step, then not able to flex/ext after that.Marland Kitchen RLE Sensation:  (altered intensity on the R side) RLE Coordination: decreased fine motor LLE Deficits / Details: neck and arm pain/tightness, o/w WFL    Cervical / Trunk Assessment Cervical / Trunk Assessment: Other exceptions Cervical / Trunk Exceptions: R bias  Communication   Communication: No difficulties  Cognition Arousal/Alertness: Awake/alert Behavior During Therapy: Restless;Agitated Overall Cognitive Status: No family/caregiver present to determine baseline cognitive functioning                                 General Comments: easily agitated; apparent life stressors      General Comments General comments (skin integrity, edema, etc.): Pt discussed life stressors of not haivng housing or money at this time    Exercises     Assessment/Plan    PT Assessment Patient needs continued PT services  PT Problem List Decreased strength;Decreased activity tolerance;Decreased balance;Decreased mobility;Decreased coordination;Impaired sensation;Decreased knowledge of precautions       PT Treatment Interventions DME instruction;Gait training;Stair training;Functional mobility training;Therapeutic activities;Balance training;Neuromuscular re-education;Patient/family education    PT Goals (Current goals can be found in the Care Plan section)  Acute Rehab PT Goals Patient Stated Goal: to have someone help him find somewhere to live PT Goal Formulation: With patient Time For Goal Achievement: 11/30/19 Potential to Achieve Goals: Good    Frequency Min 3X/week   Barriers to discharge Decreased caregiver support no home to d/c to at this point    Co-evaluation PT/OT/SLP Co-Evaluation/Treatment: Yes Reason for Co-Treatment: For patient/therapist safety PT goals addressed  during session: Mobility/safety with mobility OT goals addressed during session: ADL's and self-care       AM-PAC PT "6 Clicks" Mobility  Outcome Measure Help needed turning from your back to your side while in a flat bed without using bedrails?: None Help needed moving from lying on your back to sitting on the side of a flat bed without using bedrails?: None Help needed moving to and from a bed to a chair (including a wheelchair)?: Total Help needed standing up from a chair using your arms (e.g., wheelchair or bedside chair)?: Total Help needed to walk in hospital room?: Total Help needed climbing 3-5 steps with a railing? : Total 6 Click Score: 12    End of Session   Activity Tolerance: Patient tolerated treatment well Patient left: in bed;with call bell/phone within reach Nurse Communication: Mobility status PT Visit Diagnosis: Unsteadiness on feet (R26.81);Other abnormalities of gait and mobility (R26.89);Other symptoms and signs involving the nervous system (R29.898)    Time: 4818-5631 PT Time Calculation (min) (ACUTE ONLY): 24 min   Charges:   PT Evaluation $PT Eval Moderate Complexity: 1 Mod          11/23/2019  Jacinto Halim., PT Acute Rehabilitation Services [phone]  (pager) [phone]  (office)  Eliseo Gum Jerry Mills 11/23/2019, 12:52 PM

## 2019-11-23 NOTE — ED Notes (Signed)
Pt being discharged by admitting. This RN and the NT attempted to get a last set of vitals before pt left. Pt did not want anything else done. Pt stated he just wanted to get out of here and get away from here. Pt upset about not having anywhere to go and possibly going through withdrawal. This RN reached out to SW who provided him with a list of resources/shelters in the area and a bus pass. Pt gathered his belongings and went to the front willingly.

## 2019-11-23 NOTE — ED Notes (Signed)
Pt states he is really scared and needs help. Pt states he feels like he is going through alcohol withdrawal. Pt states its been 24 hrs since his last drink but admits he drinks pretty heavily recently due to his daughters death. Pt is diaphoretic, trembling. This RN performed a CWIA scale and will notify MD.

## 2019-11-26 LAB — I-STAT CHEM 8, ED
BUN: 13 mg/dL (ref 6–20)
Calcium, Ion: 1.01 mmol/L — ABNORMAL LOW (ref 1.15–1.40)
Chloride: 103 mmol/L (ref 98–111)
Creatinine, Ser: 1.3 mg/dL — ABNORMAL HIGH (ref 0.61–1.24)
Glucose, Bld: 128 mg/dL — ABNORMAL HIGH (ref 70–99)
HCT: 49 % (ref 39.0–52.0)
Hemoglobin: 16.7 g/dL (ref 13.0–17.0)
Potassium: 6.6 mmol/L (ref 3.5–5.1)
Sodium: 132 mmol/L — ABNORMAL LOW (ref 135–145)
TCO2: 24 mmol/L (ref 22–32)

## 2022-03-06 IMAGING — CT CT ANGIO CHEST-ABD-PELV FOR DISSECTION W/ AND WO/W CM
2 of 7 series · 14 of 46 positions shown, 16 images · IV contrast (APPLIED)
Comparison: None.

CLINICAL DATA: Stroke follow-up. Chest pain. Concern for
dissection.

EXAM:
CT ANGIOGRAPHY CHEST, ABDOMEN AND PELVIS
TECHNIQUE: Non-contrast CT of the chest was initially obtained.

[Series 6: arterial · axial · arterial · 0.74mm/px · z∈[-650,-84]mm · 11 of 321 slices shown, 13 images]
[im 19/321  soft-tissue]
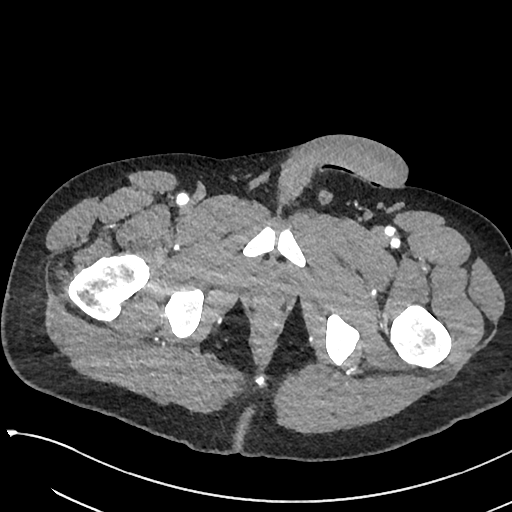
[im 19/321  bone]
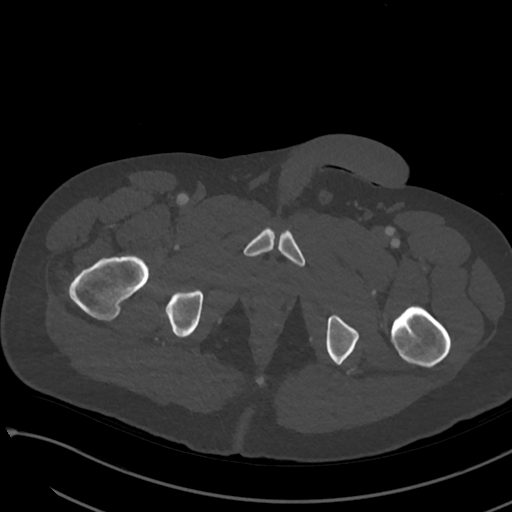
[im 57/321  soft-tissue]
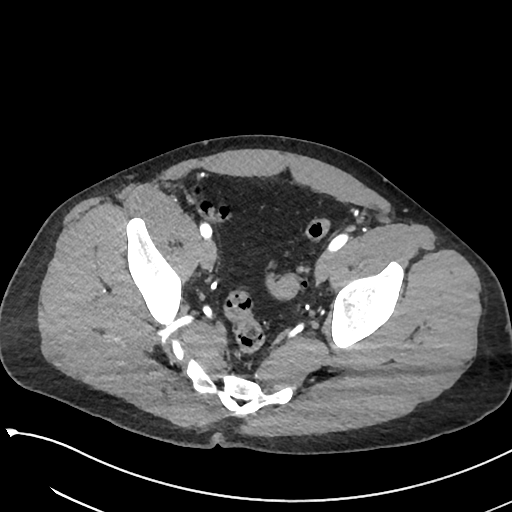
[im 76/321  soft-tissue]
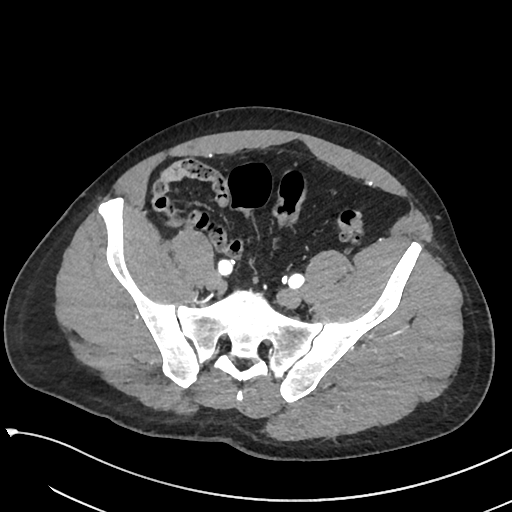
[im 113/321  soft-tissue]
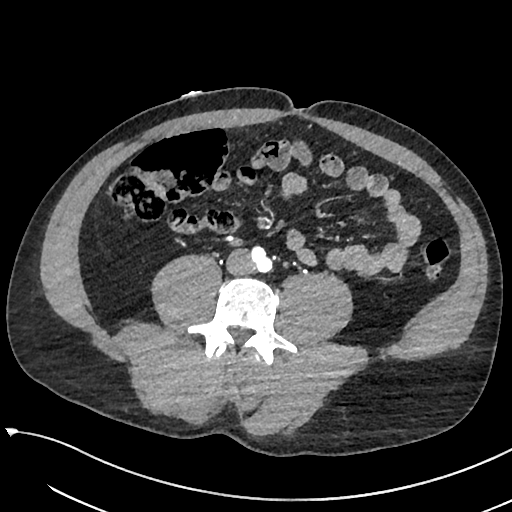
[im 132/321  soft-tissue]
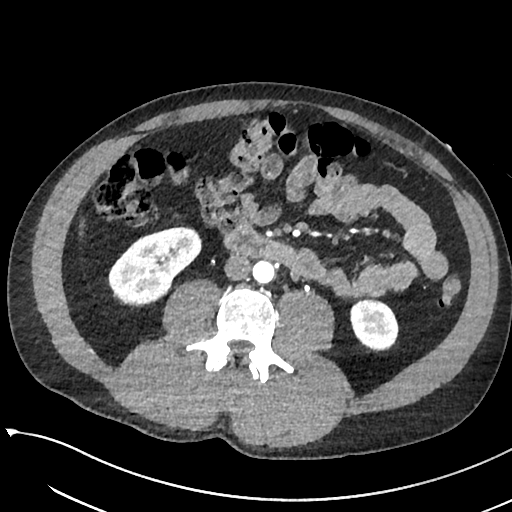
[im 170/321  soft-tissue]
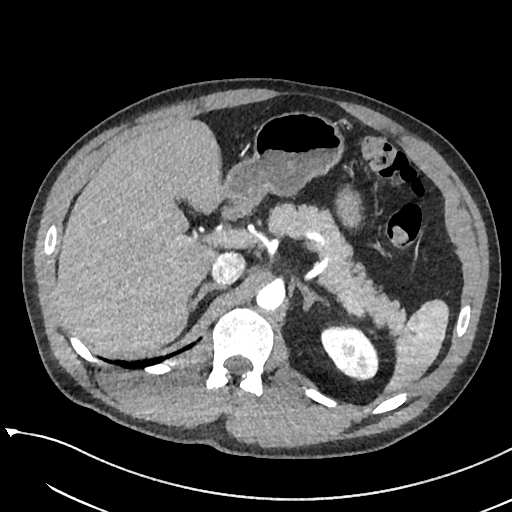
[im 189/321  soft-tissue]
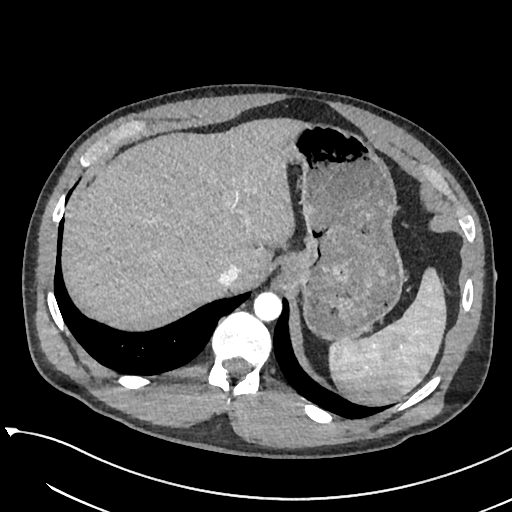
[im 208/321  soft-tissue]
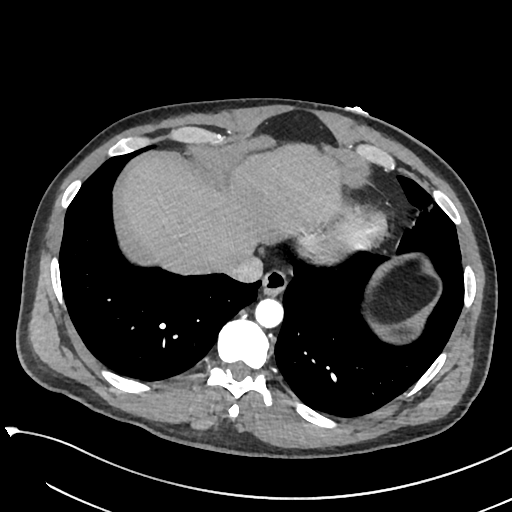
[im 245/321  soft-tissue]
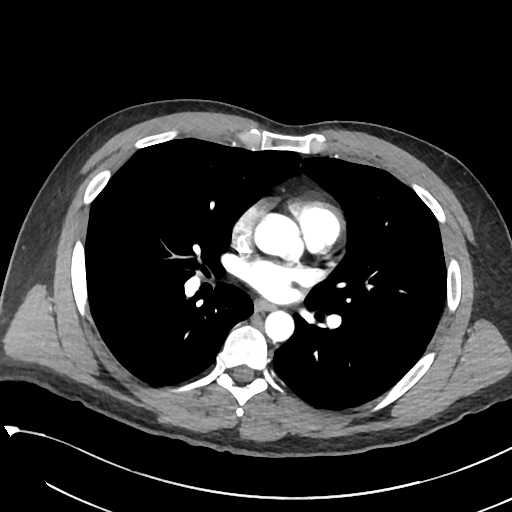
[im 245/321  bone]
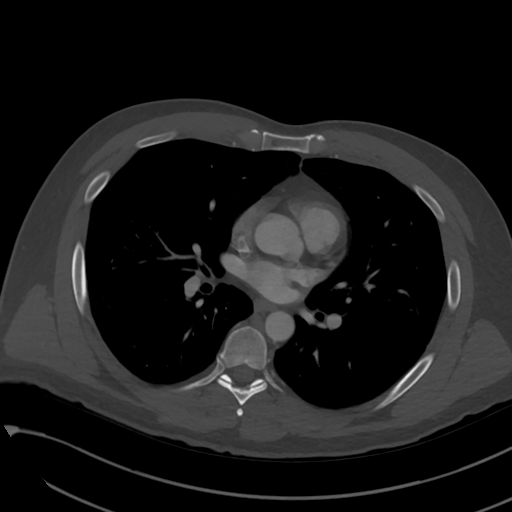
[im 264/321  soft-tissue]
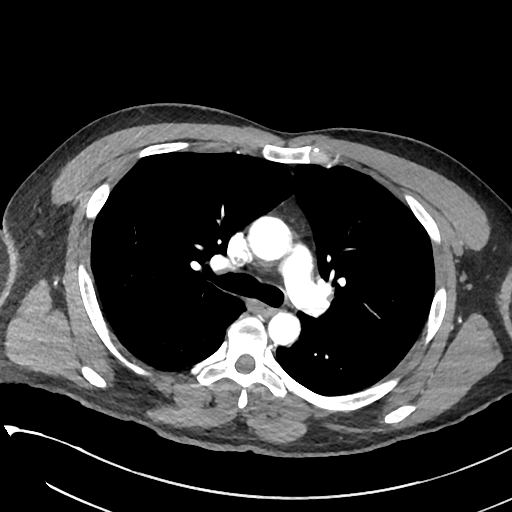
[im 302/321  soft-tissue]
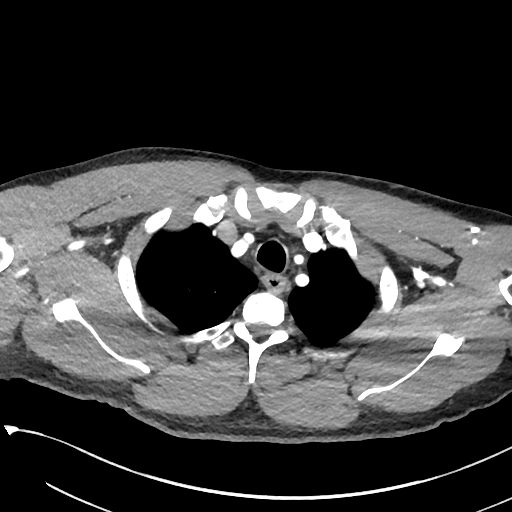

[Series 9: cor · coronal · 0.82mm/px · 3 of 151 slices shown]
[im 38/151  soft-tissue]
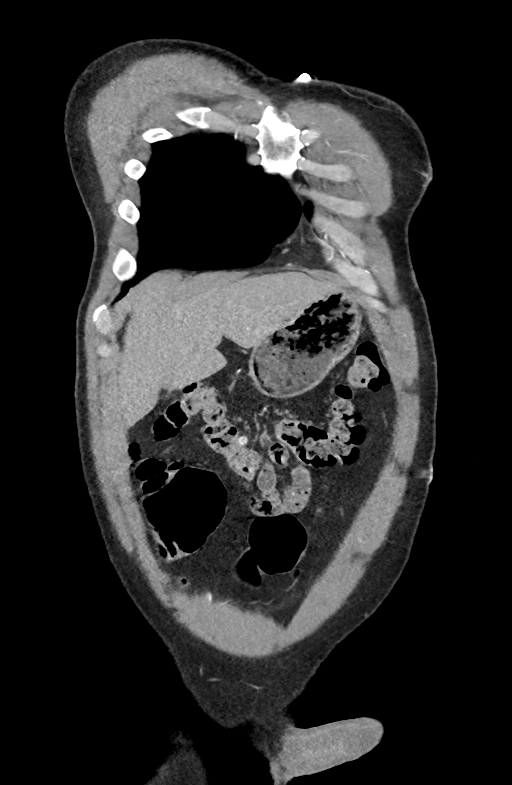
[im 76/151  soft-tissue]
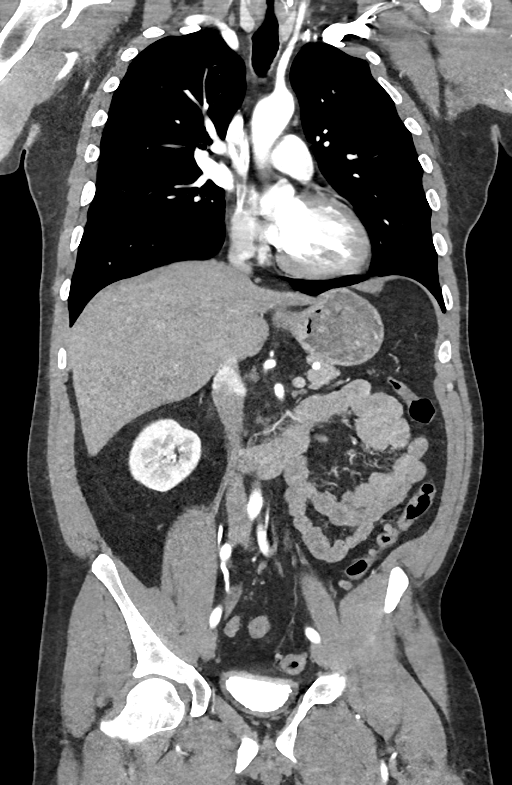
[im 113/151  soft-tissue]
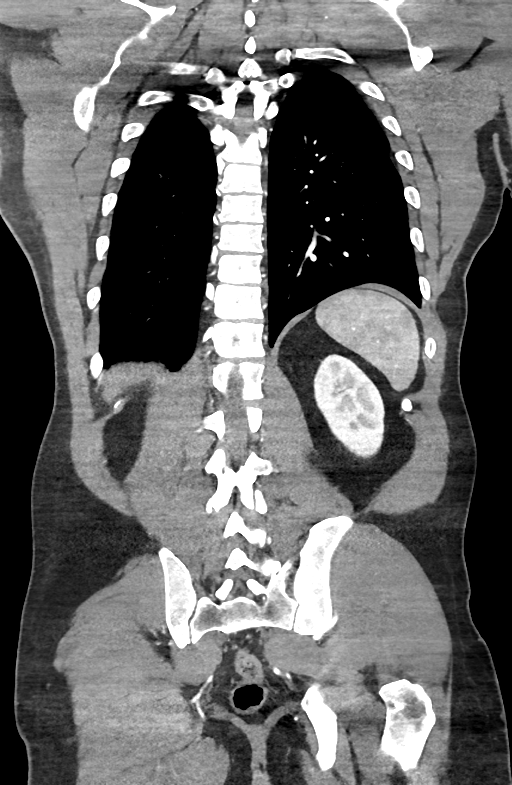

[14 of 46 positions shown; findings below may reference images not displayed]

Multidetector CT imaging through the chest, abdomen and pelvis was
performed using the standard protocol during bolus administration of
intravenous contrast. Multiplanar reconstructed images and MIPs were
obtained and reviewed to evaluate the vascular anatomy.

CONTRAST:  100mL OMNIPAQUE IOHEXOL 350 MG/ML SOLN
FINDINGS: CTA CHEST FINDINGS

Cardiovascular: There is no evidence for thoracic aortic dissection
or aneurysm. There is no large centrally located pulmonary embolism.
The heart size is unremarkable. There is no significant pericardial
effusion.

Mediastinum/Nodes:

-- No mediastinal lymphadenopathy.

-- No hilar lymphadenopathy.

-- No axillary lymphadenopathy.

-- No supraclavicular lymphadenopathy.

-- Normal thyroid gland where visualized.

-  Unremarkable esophagus.

Lungs/Pleura: Airways are patent. No pleural effusion, lobar
consolidation, pneumothorax or pulmonary infarction.

Musculoskeletal: No chest wall abnormality. No bony spinal canal
stenosis.

Review of the MIP images confirms the above findings.

CTA ABDOMEN AND PELVIS FINDINGS

VASCULAR

Aorta: Normal caliber aorta without aneurysm, dissection, vasculitis
or significant stenosis.

Celiac: Patent without evidence of aneurysm, dissection, vasculitis
or significant stenosis.

SMA: Patent without evidence of aneurysm, dissection, vasculitis or
significant stenosis.

Renals: Both renal arteries are patent without evidence of aneurysm,
dissection, vasculitis, fibromuscular dysplasia or significant
stenosis.

IMA: Patent without evidence of aneurysm, dissection, vasculitis or
significant stenosis.

Inflow: Patent without evidence of aneurysm, dissection, vasculitis
or significant stenosis.

Veins: No obvious venous abnormality within the limitations of this
arterial phase study.

Review of the MIP images confirms the above findings.

NON-VASCULAR

Hepatobiliary: The liver is normal. Normal gallbladder.There is no
biliary ductal dilation.

Pancreas: Normal contours without ductal dilatation. No
peripancreatic fluid collection.

Spleen: Unremarkable.

Adrenals/Urinary Tract:

--Adrenal glands: Unremarkable.

--Right kidney/ureter: No hydronephrosis or radiopaque kidney
stones.

--Left kidney/ureter: No hydronephrosis or radiopaque kidney stones.

--Urinary bladder: Unremarkable.

Stomach/Bowel:

--Stomach/Duodenum: No hiatal hernia or other gastric abnormality.
Normal duodenal course and caliber.

--Small bowel: Unremarkable.

--Colon: There is scattered colonic diverticula without CT evidence
for diverticulitis.

--Appendix: Normal.

Lymphatic:

--No retroperitoneal lymphadenopathy.

--No mesenteric lymphadenopathy.

--No pelvic or inguinal lymphadenopathy.

Reproductive: Unremarkable

Other: No ascites or free air. There is a small fat containing right
inguinal hernia.

Musculoskeletal. No acute displaced fractures.

Review of the MIP images confirms the above findings.
IMPRESSION: 1. No acute thoracic, abdominal or pelvic pathology. Specifically,
there is no evidence for aortic dissection or aneurysm.
2. Small fat containing right inguinal hernia.
3. Scattered colonic diverticula without CT evidence for
diverticulitis.
